# Patient Record
Sex: Female | Born: 1990 | Race: White | Hispanic: No | Marital: Married | Smoking: Never smoker
Health system: Southern US, Community
[De-identification: ages and names within clinical notes are randomized; demographics above are authoritative.]

## PROBLEM LIST (undated history)

## (undated) ENCOUNTER — Inpatient Hospital Stay (HOSPITAL_COMMUNITY): Payer: Self-pay

## (undated) DIAGNOSIS — E282 Polycystic ovarian syndrome: Secondary | ICD-10-CM

## (undated) DIAGNOSIS — K900 Celiac disease: Secondary | ICD-10-CM

## (undated) HISTORY — PX: APPENDECTOMY: SHX54

## (undated) HISTORY — PX: OTHER SURGICAL HISTORY: SHX169

## (undated) HISTORY — PX: TONSILLECTOMY: SUR1361

## (undated) HISTORY — PX: GASTRIC BYPASS: SHX52

---

## 2015-07-21 ENCOUNTER — Other Ambulatory Visit: Payer: Self-pay | Admitting: Orthopedic Surgery

## 2015-07-21 DIAGNOSIS — S92241A Displaced fracture of medial cuneiform of right foot, initial encounter for closed fracture: Secondary | ICD-10-CM

## 2015-07-23 ENCOUNTER — Ambulatory Visit
Admission: RE | Admit: 2015-07-23 | Discharge: 2015-07-23 | Disposition: A | Discharge status: Home / Self Care | Payer: BLUE CROSS/BLUE SHIELD | Source: Ambulatory Visit | Attending: Orthopedic Surgery | Admitting: Orthopedic Surgery

## 2015-07-23 DIAGNOSIS — S92241A Displaced fracture of medial cuneiform of right foot, initial encounter for closed fracture: Secondary | ICD-10-CM

## 2016-02-18 ENCOUNTER — Other Ambulatory Visit: Payer: Self-pay | Admitting: Obstetrics and Gynecology

## 2016-02-20 ENCOUNTER — Other Ambulatory Visit (HOSPITAL_COMMUNITY): Payer: Self-pay | Admitting: Obstetrics and Gynecology

## 2016-02-20 ENCOUNTER — Encounter (HOSPITAL_COMMUNITY): Payer: Self-pay | Admitting: Obstetrics and Gynecology

## 2016-02-20 DIAGNOSIS — Q369 Cleft lip, unilateral: Secondary | ICD-10-CM

## 2016-02-26 ENCOUNTER — Encounter (HOSPITAL_COMMUNITY): Payer: Self-pay

## 2016-02-26 ENCOUNTER — Encounter (HOSPITAL_COMMUNITY): Payer: Self-pay | Admitting: *Deleted

## 2016-02-26 ENCOUNTER — Inpatient Hospital Stay (HOSPITAL_COMMUNITY): Payer: Medicaid Other

## 2016-02-26 ENCOUNTER — Inpatient Hospital Stay (EMERGENCY_DEPARTMENT_HOSPITAL)
Admission: AD | Admit: 2016-02-26 | Discharge: 2016-02-26 | Disposition: A | Discharge status: Home / Self Care | Payer: Medicaid Other | Source: Ambulatory Visit | Attending: Obstetrics & Gynecology | Admitting: Obstetrics & Gynecology

## 2016-02-26 DIAGNOSIS — Z3A19 19 weeks gestation of pregnancy: Secondary | ICD-10-CM

## 2016-02-26 DIAGNOSIS — O26892 Other specified pregnancy related conditions, second trimester: Secondary | ICD-10-CM

## 2016-02-26 DIAGNOSIS — O42919 Preterm premature rupture of membranes, unspecified as to length of time between rupture and onset of labor, unspecified trimester: Secondary | ICD-10-CM | POA: Diagnosis not present

## 2016-02-26 DIAGNOSIS — N898 Other specified noninflammatory disorders of vagina: Secondary | ICD-10-CM

## 2016-02-26 DIAGNOSIS — O4702 False labor before 37 completed weeks of gestation, second trimester: Secondary | ICD-10-CM

## 2016-02-26 LAB — AMNISURE RUPTURE OF MEMBRANE (ROM) NOT AT ARMC: AMNISURE: POSITIVE

## 2016-02-26 LAB — WET PREP, GENITAL
Clue Cells Wet Prep HPF POC: NONE SEEN
SPERM: NONE SEEN
TRICH WET PREP: NONE SEEN
WBC WET PREP: NONE SEEN
Yeast Wet Prep HPF POC: NONE SEEN

## 2016-02-26 NOTE — MAU Provider Note (Signed)
Chief Complaint: No chief complaint on file.   First Provider Initiated Contact with Patient 02/26/16 2008      SUBJECTIVE HPI: Amanda [name] is a 25 y.o. G3P0020 at [redacted]w[redacted]d by LMP who presents to maternity admissions reporting gush of fluid last night that was light brown.  She reports a hx of subchorionic hemorrhage this pregnancy followed by her OB practice in [place].  She had the gush of fluid but thought initially it was related to her [facility].  She called her office and was seen today for her symptoms and had exam and [place].  She was told her water was broken and she could be admitted for IOL or D&E.  Pt came to MAU tonight for second opinion/more information about prognosis. She denies vaginal bleeding, vaginal itching/burning, urinary symptoms, h/a, dizziness, n/v, or fever/chills.     HPI  History reviewed. No pertinent past medical history. Past Surgical History  Procedure Laterality Date  . Tonsillectomy    . Appendectomy    . Foot surgery      Social History   Social History  . Marital Status: Married    Spouse Name: N/A  . Number of Children: N/A  . Years of Education: N/A   Occupational History  . Not on file.   Social History Main Topics  . Smoking status: Not on file  . Smokeless tobacco: Not on file  . Alcohol Use: Not on file  . Drug Use: Not on file  . Sexual Activity: Not on file   Other Topics Concern  . Not on file   Social History Narrative   No current facility-administered medications on file prior to encounter.   No current outpatient prescriptions on file prior to encounter.   Allergies  Allergen Reactions  . Gluten Meal Diarrhea    Celiac disease    ROS:  Review of Systems  Constitutional: Negative for fever, chills and fatigue.  Respiratory: Negative for shortness of breath.   Cardiovascular: Negative for chest pain.  Genitourinary: Positive for vaginal discharge. Negative for dysuria, flank pain, vaginal bleeding, difficulty urinating,  vaginal pain and pelvic pain.  Neurological: Negative for dizziness and headaches.  Psychiatric/Behavioral: Negative.      I have reviewed patient's Past Medical Hx, Surgical Hx, Family Hx, Social Hx, medications and allergies.   Physical Exam  Patient Vitals for the past 24 hrs:  BP Temp Temp src Pulse Resp Height Weight  02/26/16 1927 149/92 mmHg 99 F (37.2 C) Oral 98 20  (1.549 m) 261 lb 12 oz (118.729 kg)   Constitutional: Well-developed, well-nourished female in no acute distress.  Cardiovascular: normal rate Respiratory: normal effort GI: Abd soft, non-tender. Pos BS x 4 MS: Extremities nontender, no edema, normal ROM Neurologic: Alert and oriented x 4.  GU: Neg CVAT.  SS EXAM: Cervix pink, visually closed, without lesion, scant brown discharge, vaginal walls and external genitalia normal  FHT 160 by doppler  LAB RESULTS Results for orders placed or performed during the hospital encounter of 02/26/16 (from the past 24 hour(s))  Wet prep, genital     Status: None   Collection Time: 02/26/16  8:18 PM  Result Value Ref Range   Yeast Wet Prep HPF POC NONE SEEN NONE SEEN   Trich, Wet Prep NONE SEEN NONE SEEN   Clue Cells Wet Prep HPF POC NONE SEEN NONE SEEN   WBC, Wet Prep HPF POC NONE SEEN NONE SEEN   Sperm NONE SEEN   [facility] (  [facility]     Status: None   Collection Time: 02/26/16  8:18 PM  Result Value Ref Range   Amnisure ROM POSITIVE        IMAGING No results found. Preliminary with AFI 3.9, FHR 168   MAU Management/MDM: Ordered labs and US and reviewed results.  Likely previable PPROM with positive amnisure and oligohydramnios.  Discussed results and poor prognosis with pt.  Discussed options including expectant management, D&E (scheduled at later date), and admission tonight for IOL.  Pt prefers expectant management tonight and reports she [facility].  Recommend pt keep that appointment  and continue to discuss options.  Questions answered.  Pt stable at time of discharge.  ASSESSMENT 1. Preterm premature rupture of membranes (PPROM) with unknown onset of labor   2. [redacted] weeks gestation of pregnancy   3. Threatened preterm labor, second trimester   4. Vaginal discharge during pregnancy in second trimester     PLAN Discharge home Keep appt with MFM tomorrow    Medication List    TAKE these medications        CONCEPT DHA 53.5-38-1 MG Caps  Take 1 capsule by mouth daily.           Follow-up Information    Follow up with [facility].   Specialty:  Maternal and Fetal Medicine   Why:  Tomorrow as scheduled, Return to MAU as needed for emergencies   Contact information:   [address] [id number] mc [place] [id number] [phone]      Sharen CounterLisa Leftwich-Kirby Certified Nurse-Midwife 02/26/2016  9:34 PM

## 2016-02-26 NOTE — MAU Note (Addendum)
PT SAYS SHE  WAS LYING IN BED LAST NIGHT  AT 2330   -   FELT   A PINCH-   THEN SAW  BROWN  WATER-   X2 HRS.   DID  NOT  COME  IN - BC  THOUGHT   IT WAS PART OF  BLOOD CLOT.   WENT  TO OFFICE  THIS  AM  - U/S  SAYS  SROM-    BABY HAD  FHR.         VE - CLOSED.         TOLD  IF CRAMPING    TO COME  TO MAU.      STARTED  CRAMPING   AT 4 PM   SAYS NOTHING  COMING  OUT  NOW.

## 2016-02-26 NOTE — Discharge Instructions (Signed)

## 2016-02-27 ENCOUNTER — Ambulatory Visit (HOSPITAL_COMMUNITY)
Admission: RE | Admit: 2016-02-27 | Discharge: 2016-02-27 | Disposition: A | Discharge status: Home / Self Care | Payer: Medicaid Other | Source: Ambulatory Visit | Attending: Obstetrics and Gynecology | Admitting: Obstetrics and Gynecology

## 2016-02-27 ENCOUNTER — Other Ambulatory Visit (HOSPITAL_COMMUNITY): Payer: Self-pay | Admitting: Obstetrics and Gynecology

## 2016-02-27 ENCOUNTER — Encounter (HOSPITAL_COMMUNITY): Payer: Self-pay

## 2016-02-27 ENCOUNTER — Encounter (HOSPITAL_COMMUNITY): Payer: Self-pay | Admitting: *Deleted

## 2016-02-27 ENCOUNTER — Inpatient Hospital Stay (HOSPITAL_COMMUNITY)
Admission: AD | Admit: 2016-02-27 | Discharge: 2016-02-29 | DRG: 781 | Disposition: A | Discharge status: Home / Self Care | Payer: Medicaid Other | Source: Ambulatory Visit | Attending: Obstetrics & Gynecology | Admitting: Obstetrics & Gynecology

## 2016-02-27 VITALS — BP 130/86 | HR 84 | Wt 260.5 lb

## 2016-02-27 DIAGNOSIS — O42919 Preterm premature rupture of membranes, unspecified as to length of time between rupture and onset of labor, unspecified trimester: Secondary | ICD-10-CM | POA: Diagnosis not present

## 2016-02-27 DIAGNOSIS — Q369 Cleft lip, unilateral: Secondary | ICD-10-CM

## 2016-02-27 DIAGNOSIS — O99820 Streptococcus B carrier state complicating pregnancy: Secondary | ICD-10-CM | POA: Diagnosis present

## 2016-02-27 DIAGNOSIS — O468X2 Other antepartum hemorrhage, second trimester: Secondary | ICD-10-CM | POA: Diagnosis present

## 2016-02-27 DIAGNOSIS — Z3A19 19 weeks gestation of pregnancy: Secondary | ICD-10-CM

## 2016-02-27 DIAGNOSIS — Z91018 Allergy to other foods: Secondary | ICD-10-CM | POA: Diagnosis not present

## 2016-02-27 DIAGNOSIS — O283 Abnormal ultrasonic finding on antenatal screening of mother: Secondary | ICD-10-CM

## 2016-02-27 DIAGNOSIS — Z3A18 18 weeks gestation of pregnancy: Secondary | ICD-10-CM

## 2016-02-27 DIAGNOSIS — O359XX0 Maternal care for (suspected) fetal abnormality and damage, unspecified, not applicable or unspecified: Secondary | ICD-10-CM

## 2016-02-27 DIAGNOSIS — Z3689 Encounter for other specified antenatal screening: Secondary | ICD-10-CM

## 2016-02-27 DIAGNOSIS — O4702 False labor before 37 completed weeks of gestation, second trimester: Secondary | ICD-10-CM | POA: Diagnosis not present

## 2016-02-27 DIAGNOSIS — O42912 Preterm premature rupture of membranes, unspecified as to length of time between rupture and onset of labor, second trimester: Secondary | ICD-10-CM

## 2016-02-27 HISTORY — DX: Polycystic ovarian syndrome: E28.2

## 2016-02-27 HISTORY — DX: Celiac disease: K90.0

## 2016-02-27 LAB — TYPE AND SCREEN
ABO/RH(D): A POS
Antibody Screen: NEGATIVE

## 2016-02-27 LAB — RAPID URINE DRUG SCREEN, HOSP PERFORMED
Amphetamines: NOT DETECTED
BARBITURATES: NOT DETECTED
BENZODIAZEPINES: NOT DETECTED
COCAINE: NOT DETECTED
Opiates: NOT DETECTED
TETRAHYDROCANNABINOL: NOT DETECTED

## 2016-02-27 LAB — URINALYSIS, ROUTINE W REFLEX MICROSCOPIC
Bilirubin Urine: NEGATIVE
Glucose, UA: NEGATIVE mg/dL
Ketones, ur: 15 mg/dL — AB
NITRITE: NEGATIVE
PH: 5.5 (ref 5.0–8.0)
Protein, ur: NEGATIVE mg/dL
SPECIFIC GRAVITY, URINE: 1.02 (ref 1.005–1.030)

## 2016-02-27 LAB — URINE MICROSCOPIC-ADD ON

## 2016-02-27 LAB — ABO/RH: ABO/RH(D): A POS

## 2016-02-27 LAB — GC/CHLAMYDIA PROBE AMP (~~LOC~~) NOT AT ARMC
CHLAMYDIA, DNA PROBE: NEGATIVE
NEISSERIA GONORRHEA: NEGATIVE

## 2016-02-27 MED ORDER — ACETAMINOPHEN 325 MG PO TABS: 650.0000 mg | ORAL_TABLET | ORAL | Status: DC | PRN

## 2016-02-27 MED ORDER — PRENATAL MULTIVITAMIN CH
1.0000 | ORAL_TABLET | Freq: Every day | ORAL | Status: DC
  Administered 2016-02-28: 1 via ORAL
  Filled 2016-02-27: qty 1

## 2016-02-27 MED ORDER — AZITHROMYCIN 500 MG PO TABS
500.0000 mg | ORAL_TABLET | Freq: Every day | ORAL | Status: DC
  Administered 2016-02-27 – 2016-02-28 (×2): 500 mg via ORAL
  Filled 2016-02-27 (×3): qty 1

## 2016-02-27 MED ORDER — AMOXICILLIN 500 MG PO CAPS
500.0000 mg | ORAL_CAPSULE | Freq: Three times a day (TID) | ORAL | Status: DC
  Filled 2016-02-27: qty 1

## 2016-02-27 MED ORDER — CALCIUM CARBONATE ANTACID 500 MG PO CHEW: 2.0000 | CHEWABLE_TABLET | ORAL | Status: DC | PRN

## 2016-02-27 MED ORDER — SODIUM CHLORIDE 0.9 % IV SOLN
2.0000 g | Freq: Four times a day (QID) | INTRAVENOUS | Status: AC
  Administered 2016-02-27 – 2016-02-29 (×8): 2 g via INTRAVENOUS
  Filled 2016-02-27 (×8): qty 2000

## 2016-02-27 MED ORDER — DOCUSATE SODIUM 100 MG PO CAPS
100.0000 mg | ORAL_CAPSULE | Freq: Every day | ORAL | Status: DC
  Administered 2016-02-28: 100 mg via ORAL
  Filled 2016-02-27: qty 1

## 2016-02-27 MED ORDER — ZOLPIDEM TARTRATE 5 MG PO TABS: 5.0000 mg | ORAL_TABLET | Freq: Every evening | ORAL | Status: DC | PRN

## 2016-02-27 NOTE — Consult Note (Signed)
MFM consult  25 yr old G3P0020 at 6314w5d with previable PPROM, possible uterine septum, subchorionic hemorrhage, and finding of cleft lip on outside ultrasound referred by Dr. Leota JacobsenGaccione for fetal anatomic survey and consult..  Past OB hx: 2 first trimester miscarriages  This pregnancy has been complicated by intermittent vaginal bleeding from a subchorionic hemorrhage. Patient then had gush of fluid yesterday. Was evaluated and found to have a positive amnisure and oligohydramnios. Patient reports continued leaking of fluid. No fever or contractions.   Ultrasound today shows: single intrauterine pregnancy. Fetal biometry is consistent with dating. Anterior placenta without evidence of previa. There is a subchorionic hemorrahge seen in the lower uterine segment measuring 3x4cm. Oligohydramnios with maximum vertical pocket of 1.6cm. Normal transabdominal cervical length. The views of the cavum, palate, profile/nasal bone, lips, spine, heart, abdominal cord insertion, ankles, and hands are limited. On limited views of the lips there appears to be a cleft; the palate is not well visualized. The remainder of the limited anatomy survey is normal.   I counseled her as follows:  1. Appropriate fetal growth. 2. Limited anatomy survey: - recommend follow up in 3 weeks for fetal growth and reevaluate fetal anatomy - discussed limitations of ulttrasound in detecting fetal anomalies especially in the setting of oligohydramnios 3. PPROM: -  discussed increased risks of preterm delivery, preterm labor, infection, fetal demise, and nonreassuring fetal status  - discussed there is an increased risk of pulmonary hypoplasia (10-20%), fetal limb and body deformities (usually correct after delivery) with oligohydramnios/anhydramnios prior to 22 weeks  - patient currently does not have any signs/symptoms of infection or labor therefore would qualify for consideration of outpatient management after 48 hours of IV  antibiotics - if patient is managed as an outpatient recommend she take her temperature four times a day and present for immediate evaluation if has temp 100 degrees or greater or has cramping, contractions, uterine tenderness, or vaginal bleeding; she should also be seen by her provider at least once a week to evaluate for infection/labor and for fetal viability  - would recommend readmission at time of fetal viability or when patient would want intervention on fetal behalf and at that time readminister latency antibiotics and administer a course of betamethasone  - would then recommend inpatient management until delivery at 34 weeks or sooner for labor, infection, or nonreassuring fetal status  - recommend fetal ultrasounds every 3-4 weeks to evaluate growth  - once viable would recommend at least daily fetal assessment  - do not recommend strict bedrest or trendelenburg as data does not show any benefit  - once patient is admitted she will have decreased activity and therefore recommend SCDs while in bed  - recommend a NICU consult at this time to aid the patient in deciding the gestational age she would like intervention  - patient has chosen expectant management - being admitted for 48 hours of IV antibiotics; will return for prolonged hospitalization at viability for betamethasone and repeat antibiotics 4. Possible uterine septum: - cannot be seen on today's ultrasound - recommend serial growth as above - recommend reevaluate postpartum 5. Subchorionic hemorrhage: - recommend bleeding precautions 6. Likely cleft lip: - discussed possible association with other anomalies- none seen on today's limited ultrasound; discussed if found with other anomalies the increased risk of genetic conditions - when found in isolation that risk is not significantly increased - will refer to Pediatric plastic surgery - discussed neonate will need repair and aid in feeding - discussed possibility  of cleft  palate- palate not well seen on today's exam 4. Low PAPP-A: I discussed the association with fetal aneuploidy- although risk for aneuploidy was low on first trimester screen. I also discussed the association with increased risk of adverse pregnancy outcomes including increased risk of fetal growth restriction, preeclampsia, stillbirth, preterm birth, and abruption. I discussed these risks are only slightly increased over baseline but heightened surveillance is warranted. I recommend serial fetal growth ultrasounds. I recommend close surveillance for the development of signs/symptoms of preeclampsia. 5. Borderline MSAFP of 2. : - likely related to cleft lip and/or subchorionic hemorrhage  Patient sent for admission at [facility]. Discussed with Dr. Debroah Loop and Dr. Mel Almond.  I spent a total of 60 minutes with the patient of which >50% was in face to face consultation.  Eulis Foster, MD

## 2016-02-27 NOTE — H&P (Signed)
SUBJECTIVE HPI: Amanda [name] is a 25 y.o. G3P0020 at [redacted]w[redacted]d by LMP who presents to maternity admissions reporting gush of fluid last night that was light brown. She reports a hx of subchorionic hemorrhage this pregnancy followed by her OB practice in [place]. She had the gush of fluid but thought initially it was related to her [facility]. She called her office and was seen today for her symptoms and had exam and [place]. She was told her water was broken and she could be admitted for IOL or D&E. Pt came to MAU tonight for second opinion/more information about prognosis. She denies vaginal bleeding, vaginal itching/burning, urinary symptoms, h/a, dizziness, n/v, or fever/chills.  [place] in [facility] today confirmed ROM and Dr. Vincenza Hews recommended admission for 48 hr of IV antibiotic  HPI  History reviewed. No pertinent past medical history. Past Surgical History  Procedure Laterality Date  . Tonsillectomy    . Appendectomy    . Foot surgery      Social History   Social History  . Marital Status: Married    Spouse Name: N/A  . Number of Children: N/A  . Years of Education: N/A   Occupational History  . Not on file.   Social History Main Topics  . Smoking status: Not on file  . Smokeless tobacco: Not on file  . Alcohol Use: Not on file  . Drug Use: Not on file  . Sexual Activity: Not on file   Other Topics Concern  . Not on file   Social History Narrative   No current facility-administered medications on file prior to encounter.   No current outpatient prescriptions on file prior to encounter.   Allergies  Allergen Reactions  . Gluten Meal Diarrhea    Celiac disease    ROS:  Review of Systems  Constitutional: Negative for fever, chills and fatigue.  Respiratory: Negative for shortness of breath.  Cardiovascular: Negative for chest pain.  Genitourinary: Positive for vaginal discharge. Negative for  dysuria, flank pain, vaginal bleeding, difficulty urinating, vaginal pain and pelvic pain.  Neurological: Negative for dizziness and headaches.  Psychiatric/Behavioral: Negative.     I have reviewed patient's Past Medical Hx, Surgical Hx, Family Hx, Social Hx, medications and allergies.   Physical Exam  Patient Vitals for the past 24 hrs:  BP Temp Temp src Pulse Resp Height Weight  02/26/16 1927 149/92 mmHg 99 F (37.2 C) Oral 98 20  (1.549 m) 261 lb 12 oz (118.729 kg)  Blood pressure 126/84, pulse 76, temperature 98.7 F (37.1 C), temperature source Oral, resp. rate 18, last menstrual period 10/12/2015, SpO2 100 %.  Constitutional: Well-developed, well-nourished female in no acute distress.  Cardiovascular: normal rate Respiratory: normal effort GI: Abd soft, non-tender. Pos BS x 4 MS: Extremities nontender, no edema, normal ROM Neurologic: Alert and oriented x 4.  GU: Neg CVAT.  SS EXAM: done in MAU 02/26/16 Cervix pink, visually closed, without lesion, scant brown discharge, vaginal walls and external genitalia normal  FHT 160 by doppler  LAB RESULTS  Lab Results Last 24 Hours    Results for orders placed or performed during the hospital encounter of 02/26/16 (from the past 24 hour(s))  Wet prep, genital Status: None   Collection Time: 02/26/16 8:18 PM  Result Value Ref Range   Yeast Wet Prep HPF POC NONE SEEN NONE SEEN   Trich, Wet Prep NONE SEEN NONE SEEN   Clue Cells Wet Prep HPF POC NONE SEEN NONE SEEN   WBC, Wet Prep  HPF POC NONE SEEN NONE SEEN   Sperm NONE SEEN   Amnisure [facility] ([facility] Status: None   Collection Time: 02/26/16 8:18 PM  Result Value Ref Range   Amnisure ROM POSITIVE          IMAGING        Preliminary with AFI 3.9, FHR 168 Per Dr Vincenza HewsQuinn low AFI confirmed with   ASSESSMENT 1. Preterm premature rupture of membranes (PPROM) with unknown onset of  labor   2. [redacted] weeks gestation of pregnancy   3. Threatened preterm labor, second trimester   4. Vaginal discharge during pregnancy in second trimester         Medication List    TAKE these medications       CONCEPT DHA 53.5-38-1 MG Caps  Take 1 capsule by mouth daily.              Plan: Discussed with Dr. Vincenza HewsQuinn who requested admission for latency antibiotics and expectant management with 48 hr of IV antibiotics followed by PO therapy for a total course of 7 days   Adam PhenixJames G Arnold, MD 02/27/2016 1:18 PM

## 2016-02-28 LAB — CULTURE, BETA STREP (GROUP B ONLY)

## 2016-02-28 NOTE — Progress Notes (Signed)
Results for Danton SewerOWENS, Zamorah (MRN [id number]) as of 02/28/2016 16:15  Ref. Range 02/27/2016 13:45  CULTURE, BETA STREP (GROUP B ONLY) Unknown Rpt (A)  Karyl KinnierKim Newton, CNM notified of Positive GBS culture.

## 2016-02-28 NOTE — Progress Notes (Signed)
Chaplain visit the result of a Spiritual Consult placed in Amanda [name]' medical record. Amanda [name] is apprehensive about the continuance of her pregnancy given that the rupture of membranes and, thus, the viability issues that may ensue. She is being given strong spiritual support by her community of faith, her family and friends. She reports she is less stressful when they visit, than the hours she spends alone in her room. She is open about the anxiety she feels concerning the possible loss of her son. Chaplains and staff may present an unknown stress factor if perceived as visiting to prepare Amanda [name] for fetal demise, rather than our true care goal of providing exceptional care no matter what may happen. In light of this it is important that chaplains continue to provide confidential emotional and spiritual support with a focus on stress reduction and spiritual support. Support to family is important as well, ensuring they have the emotional and spiritual care they need during this time of hope and waiting,  Chaplain follow-up is strongly advised.  Benjie Karvonenharles D. Dalina Samara, DMin, MDiv, MA Chaplain

## 2016-02-28 NOTE — Progress Notes (Signed)
FACULTY PRACTICE ANTEPARTUM(COMPREHENSIVE) NOTE  Danton SewerShawnee Melching is a 25 y.o. G3P0020 at 5333w6d by early ultrasound who is admitted for rupture of membranes.   Fetal presentation is unsure. Length of Stay:  1  Days  Subjective:  Patient reports the fetal movement as doesn't feel yet. Patient reports uterine contraction  activity as none. Patient reports  vaginal bleeding as none. Patient describes fluid per vagina as None.  Vitals:  Blood pressure 110/62, pulse 74, temperature 98.2 F (36.8 C), temperature source Oral, resp. rate 20, height 5\' 1"  (1.549 m), weight 117.935 kg (260 lb), last menstrual period 10/12/2015, SpO2 98 %. Physical Examination:  General appearance - alert, well appearing, and in no distress Heart - normal rate and regular rhythm Abdomen - soft, nontender, Fundal Height:  size equals dates not tender Membranes:intact, ruptured  Fetal Monitoring:     Fetal Heart Rate A      Mode  Doppler filed at 02/27/2016 2321    Baseline Rate (A)  151 bpm filed at 02/27/2016 2321      Labs:  Results for orders placed or performed during the hospital encounter of 02/27/16 (from the past 24 hour(s))  Type and screen [facility]   Collection Time: 02/27/16  1:22 PM  Result Value Ref Range   ABO/RH(D) A POS    Antibody Screen NEG    Sample Expiration 03/01/2016   ABO/Rh   Collection Time: 02/27/16  1:22 PM  Result Value Ref Range   ABO/RH(D) A POS   Urinalysis, Routine w reflex microscopic (not at [facility])   Collection Time: 02/27/16  1:44 PM  Result Value Ref Range   Color, Urine YELLOW YELLOW   APPearance CLEAR CLEAR   Specific Gravity, Urine 1.020 1.005 - 1.030   pH 5.5 5.0 - 8.0   Glucose, UA NEGATIVE NEGATIVE mg/dL   Hgb urine dipstick LARGE (A) NEGATIVE   Bilirubin Urine NEGATIVE NEGATIVE   Ketones, ur 15 (A) NEGATIVE mg/dL   Protein, ur NEGATIVE NEGATIVE mg/dL   Nitrite NEGATIVE NEGATIVE   Leukocytes, UA TRACE (A) NEGATIVE  Urine rapid  drug screen (hosp performed)   Collection Time: 02/27/16  1:44 PM  Result Value Ref Range   Opiates NONE DETECTED NONE DETECTED   Cocaine NONE DETECTED NONE DETECTED   Benzodiazepines NONE DETECTED NONE DETECTED   Amphetamines NONE DETECTED NONE DETECTED   Tetrahydrocannabinol NONE DETECTED NONE DETECTED   Barbiturates NONE DETECTED NONE DETECTED  Urine microscopic-add on   Collection Time: 02/27/16  1:44 PM  Result Value Ref Range   Squamous Epithelial / LPF 6-30 (A) NONE SEEN   WBC, UA 0-5 0 - 5 WBC/hpf   RBC / HPF 0-5 0 - 5 RBC/hpf   Bacteria, UA FEW (A) NONE SEEN    I  Medications:  Scheduled . ampicillin (OMNIPEN) IV  2 g Intravenous Q6H   Followed by  . [START ON 02/29/2016] amoxicillin  500 mg Oral Q8H  . azithromycin  500 mg Oral Daily  . docusate sodium  100 mg Oral Daily  . prenatal multivitamin  1 tablet Oral Q1200   I have reviewed the patient's current medications.  ASSESSMENT: Patient Active Problem List   Diagnosis Date Noted  . Preterm premature rupture of membranes (PPROM) with unknown onset of labor 02/27/2016    PLAN: Admitted for IV latency antibiotic for 48 hr then d/c if stable  ARNOLD,JAMES 02/28/2016,7:53 AM

## 2016-02-29 DIAGNOSIS — O42912 Preterm premature rupture of membranes, unspecified as to length of time between rupture and onset of labor, second trimester: Principal | ICD-10-CM

## 2016-02-29 MED ORDER — AMOXICILLIN 500 MG PO CAPS: 500.0000 mg | ORAL_CAPSULE | Freq: Three times a day (TID) | ORAL | Status: DC

## 2016-02-29 MED ORDER — AZITHROMYCIN 500 MG PO TABS: 500.0000 mg | ORAL_TABLET | Freq: Every day | ORAL | Status: DC

## 2016-02-29 NOTE — Discharge Instructions (Signed)
Premature Rupture and Preterm Premature Rupture of Membranes °Premature rupture of membranes (PROM) is when the membranes (amniotic sac) break open before contractions or labor starts. Rupture of membranes is commonly referred to as your water breaking. If PROM occurs before 37 weeks of pregnancy, it is called preterm premature rupture of membranes (PPROM). The amniotic sac holds the fetus, keeps infection out, and performs other important functions. Having the amniotic sac rupture before 37 weeks of pregnancy can lead to serious problems and requires immediate attention by your health care provider. °CAUSES  °PROM near the end of the pregnancy may be caused by natural weakening of the membranes. PPROM is often due to an infection. Other factors that may be associated with PROM include: °· Stretching of the amniotic sac because of carrying multiples or having too much amniotic fluid. °· Trauma. °· Smoking during pregnancy. °· Poor nutrition. °· Previous preterm birth. °· Vaginal bleeding. °· Little to no prenatal care. °· Problems with the placenta, such as placenta previa or placental abruption. °RISKS OF PROM AND PPROM °· Delivering a premature baby. °· Getting a serious infection of the placental tissues (chorioamnionitis). °· Early detachment of the placenta from the uterus (placental abruption). °· Compression of the umbilical cord. °· Needing a cesarean birth. °· Developing a serious infection after delivery. °SIGNS OF PROM OR PPROM  °· A sudden gush or slow leaking of fluid from the vagina. °· Constant wet underwear. °Sometimes, women mistake the leaking or wetness for urine, especially if the leak is slow and not a gush of fluid. If there is constant leaking or your underwear continues to get wet, your membranes have likely ruptured. °WHAT TO DO IF YOU THINK YOUR MEMBRANES HAVE RUPTURED °Call your health care provider right away. You will need to go to the hospital to get checked immediately. °WHAT HAPPENS  IF YOU ARE DIAGNOSED WITH PROM OR PPROM? °Once you arrive at the hospital, you will have tests done. A cervical exam will be performed to check if the cervix has softened or started to open (dilate). If you are diagnosed with PROM, you may be induced within 24 hours if you are not having contractions. If you are diagnosed with PPROM and are not having contractions, you may be induced depending on your trimester.  °If you have PPROM, you: °· And your baby will be monitored closely for signs of infection or other complications. °· May be given an antibiotic medicine to lower the chances of an infection developing. °· May be given a steroid medicine to help mature the baby's lungs faster. °· May be given a medicine to stop preterm labor. °· May be ordered to be on bed rest at home or in the hospital. °· May be induced if complications arise for you or the baby. °Your treatment will depend on many factors, such as how far along you are, the development of the baby, and other complications that may arise. °  °This information is not intended to replace advice given to you by your health care provider. Make sure you discuss any questions you have with your health care provider. °  °Document Released: 09/06/2005 Document Revised: 06/27/2013 Document Reviewed: 12/26/2012 °Elsevier Interactive Patient Education ©2016 [employer] ° °

## 2016-02-29 NOTE — Discharge Summary (Signed)
    OB Discharge Summary     Patient Name: Amanda [name] DOB: 08/13/1991 MRN: [id number]  Date of admission: 02/27/2016 Date of discharge: 02/29/2016  Admitting diagnosis: 18WKS, PREMATURE ROM Intrauterine pregnancy: 5240w0d     Secondary diagnosis:  Active Problems:   Preterm premature rupture of membranes (PPROM) with unknown onset of labor  Additional problems: None     Discharge diagnosis: Same                                                                                                Hospital course:  This is a 25 year old G3 P0 0-0 at 19 weeks who was admitted due to oligohydramnios and preterm premature rupture of membranes at a previable state. The patient had been sent to MFM from her primary OBs office for second opinion.  Please refer to the MFM's consult note for further details of their conversation. In short, the patient was offered admission for administration of latency antibiotics.  The patient has finished 2 days of IV antibiotics and will plead her course with oral antibiotics at home. The patient is being discharged in stable condition to follow-up in our high risk clinic within a week.  Physical exam  Filed Vitals:   02/28/16 1200 02/28/16 1725 02/28/16 2200 02/29/16 0600  BP: 107/61 143/88 136/83 118/76  Pulse: 88 104 96 97  Temp: 98.4 F (36.9 C) 98.1 F (36.7 C) 98.9 F (37.2 C) 98.4 F (36.9 C)  TempSrc: Oral Oral Oral Oral  Resp: 20 18 20 20   Height:      Weight:      SpO2: 100% 100% 97% 97%   General: alert, cooperative and no distress Heart: regular rate, no murmur Lungs: clear to auscultation bilaterally, no wheezing.  Abdomen: Soft, nontender, nondistended, appropriate bowel sounds. Fundus approximately dates. DVT Evaluation: No evidence of DVT seen on physical exam. Negative Homan's sign. No cords or calf tenderness. Labs:No results found for: WBC, HGB, HCT, MCV, PLT No flowsheet data found.  Discharge instruction: per After Visit Summary and  "Baby and Me Booklet".  After visit meds:    Medication List    TAKE these medications        amoxicillin 500 MG capsule  Commonly known as:  AMOXIL  Take 1 capsule (500 mg total) by mouth every 8 (eight) hours.     azithromycin 500 MG tablet  Commonly known as:  ZITHROMAX  Take 1 tablet (500 mg total) by mouth daily.     CONCEPT DHA 53.5-38-1 MG Caps  Take 1 capsule by mouth daily.        Diet: routine diet  Activity: Advance as tolerated. Pelvic rest for 6 weeks.   Outpatient follow up:1 week Follow up Appt:Future Appointments Date Time Provider Department Center  03/18/2016 3:45 PM [facility] US 2 [facility]   Follow up Visit:No Follow-up on file.   02/29/2016 Amanda Joye JEHIEL, DO

## 2016-02-29 NOTE — Progress Notes (Signed)
Discharge instructions reviewed with patient.  Patient states understanding of home care, medications, activity, signs/symptoms to report to MD and return MD office visit.  Patients significant other and family will assist with her care @ home.  No home  equipment needed, patient has prescriptions and all personal belongings.  Patient discharged via wheelchair in stable condition with staff without incident.  

## 2016-03-04 ENCOUNTER — Other Ambulatory Visit (HOSPITAL_COMMUNITY): Payer: Self-pay

## 2016-03-04 ENCOUNTER — Encounter (HOSPITAL_COMMUNITY): Payer: Self-pay

## 2016-03-05 ENCOUNTER — Ambulatory Visit (HOSPITAL_COMMUNITY): Payer: BLUE CROSS/BLUE SHIELD

## 2016-03-11 ENCOUNTER — Ambulatory Visit (INDEPENDENT_AMBULATORY_CARE_PROVIDER_SITE_OTHER): Payer: Medicaid Other | Admitting: Obstetrics & Gynecology

## 2016-03-11 ENCOUNTER — Encounter: Payer: Self-pay | Admitting: Obstetrics & Gynecology

## 2016-03-11 VITALS — BP 132/88 | HR 99 | Ht 62.0 in | Wt 260.0 lb

## 2016-03-11 DIAGNOSIS — O0992 Supervision of high risk pregnancy, unspecified, second trimester: Secondary | ICD-10-CM

## 2016-03-11 DIAGNOSIS — O42912 Preterm premature rupture of membranes, unspecified as to length of time between rupture and onset of labor, second trimester: Secondary | ICD-10-CM

## 2016-03-11 DIAGNOSIS — O359XX0 Maternal care for (suspected) fetal abnormality and damage, unspecified, not applicable or unspecified: Secondary | ICD-10-CM

## 2016-03-11 DIAGNOSIS — O280 Abnormal hematological finding on antenatal screening of mother: Secondary | ICD-10-CM

## 2016-03-11 DIAGNOSIS — O359XX1 Maternal care for (suspected) fetal abnormality and damage, unspecified, fetus 1: Secondary | ICD-10-CM

## 2016-03-11 DIAGNOSIS — O0990 Supervision of high risk pregnancy, unspecified, unspecified trimester: Secondary | ICD-10-CM | POA: Insufficient documentation

## 2016-03-11 DIAGNOSIS — O289 Unspecified abnormal findings on antenatal screening of mother: Secondary | ICD-10-CM

## 2016-03-11 LAB — POCT URINALYSIS DIP (DEVICE)
BILIRUBIN URINE: NEGATIVE
Glucose, UA: NEGATIVE mg/dL
HGB URINE DIPSTICK: NEGATIVE
Ketones, ur: NEGATIVE mg/dL
LEUKOCYTES UA: NEGATIVE
Nitrite: NEGATIVE
Protein, ur: NEGATIVE mg/dL
Urobilinogen, UA: 0.2 mg/dL (ref 0.0–1.0)
pH: 5 (ref 5.0–8.0)

## 2016-03-11 NOTE — Progress Notes (Signed)
Patient reports small amount of leaking at night

## 2016-03-11 NOTE — Progress Notes (Signed)
   Subjective:    Amanda [name] is a 25 y.o. G3P0020 at 2761w4d being seen today for her first obstetrical visit [facility]; was [facility]. Her obstetrical history is significant for previable PPROM, possible uterine septum, subchorionic hemorrhage, and finding of [facility]; [facility]. Also had SAB x 2.  Patient is appropriately sad, accompanied by her husband and mother. Already being followed by MFM, please refer to Dr. Elise BenneQuinn's notes on 02/27/2016 for recommendations.  Patient reports no complaints today. No abdominal pain, fevers, abnormal discharge. Scant LOF.  Filed Vitals:   03/11/16 1024 03/11/16 1025  BP: 132/88   Pulse: 99   Height:  5\' 2"  (1.575 m)  Weight: 260 lb (117.935 kg)     HISTORY: OB History  Gravida Para Term Preterm AB SAB TAB Ectopic Multiple Living  3 0 0 0 2 2 0 0 0 0     # Outcome Date GA Lbr Len/2nd Weight Sex Delivery Anes PTL Lv  3 Current           2 SAB           1 SAB              Past Medical History  Diagnosis Date  . Celiac disease   . Polycystic ovarian syndrome    Past Surgical History  Procedure Laterality Date  . Tonsillectomy    . Appendectomy    . Foot surgery      History reviewed. No pertinent family history.   Exam    Uterus:  Fundal Height: 20 cm  FHR 155  Pelvic Exam: Deferred  System: Breast:  Deferred   Skin: normal coloration and turgor, no rashes    Neurologic: oriented, normal, negative, depressed mood   Extremities: normal strength, tone, and muscle mass   HEENT PERRLA, extra ocular movement intact and sclera clear, anicteric   Mouth/Teeth mucous membranes moist, pharynx normal without lesions and dental hygiene good   Neck supple and no masses   Cardiovascular: regular rate and rhythm   Respiratory:  appears well, vitals normal, no respiratory distress, acyanotic, normal RR, chest clear, no wheezing, crepitations, rhonchi, normal  symmetric air entry   Abdomen: soft, non-tender; bowel sounds normal; no masses,  no organomegaly      Assessment:    Pregnancy: [id number] Patient Active Problem List   Diagnosis Date Noted  . Supervision of high-risk pregnancy 03/11/2016  . Preterm premature rupture of membranes in second trimester 02/27/2016        Plan:   1. Premature rupture of membranes in second trimester, unspecified duration to onset of labor Stable for now. Will come back for admission at [redacted]w[redacted]d.  Infection/bleeding precautions reviewed. Will follow MFM recommendations.  2. Supervision of high-risk pregnancy, second trimester - Prenatal Profile - Cystic fibrosis gene test - Culture, OB Urine - Alpha fetoprotein, maternal (had normal first screen) Continue prenatal vitamins. Problem list reviewed and updated. Preterm labor and fetal movement precautions reviewed. The nature of [facility] with multiple MDs and other Advanced Practice Providers was explained to patient; also emphasized that residents, students are part of our team. Follow up in 2 weeks; will [facility] then (declined to meet with her today).   Tereso NewcomerANYANWU,Melburn Treiber A, MD 03/11/2016

## 2016-03-11 NOTE — Patient Instructions (Signed)
Return to clinic for any scheduled appointments or obstetric concerns, or go to MAU for evaluation  

## 2016-03-12 LAB — PRENATAL PROFILE (SOLSTAS)
Antibody Screen: NEGATIVE
BASOS PCT: 0 %
Basophils Absolute: 0 cells/uL (ref 0–200)
EOS PCT: 1 %
Eosinophils Absolute: 105 cells/uL (ref 15–500)
HEMATOCRIT: 40.6 % (ref 35.0–45.0)
HEMOGLOBIN: 14.1 g/dL (ref 11.7–15.5)
HEP B S AG: NEGATIVE
HIV 1&2 Ab, 4th Generation: NONREACTIVE
LYMPHS ABS: 2730 {cells}/uL (ref 850–3900)
Lymphocytes Relative: 26 %
MCH: 31.4 pg (ref 27.0–33.0)
MCHC: 34.7 g/dL (ref 32.0–36.0)
MCV: 90.4 fL (ref 80.0–100.0)
MPV: 9.7 fL (ref 7.5–12.5)
Monocytes Absolute: 945 cells/uL (ref 200–950)
Monocytes Relative: 9 %
NEUTROS ABS: 6720 {cells}/uL (ref 1500–7800)
NEUTROS PCT: 64 %
Platelets: 259 10*3/uL (ref 140–400)
RBC: 4.49 MIL/uL (ref 3.80–5.10)
RDW: 13.5 % (ref 11.0–15.0)
Rh Type: POSITIVE
Rubella: 1.55 Index — ABNORMAL HIGH (ref <0.90)
WBC: 10.5 10*3/uL (ref 3.8–10.8)

## 2016-03-12 LAB — ALPHA FETOPROTEIN, MATERNAL
AFP: 173.7 ng/mL
CURR GEST AGE: 20.6 wk
MOM FOR AFP: 4.15
Open Spina bifida: POSITIVE — AB
Osb Risk: 1:13 {titer}

## 2016-03-13 LAB — CULTURE, OB URINE
Colony Count: NO GROWTH
Organism ID, Bacteria: NO GROWTH

## 2016-03-15 ENCOUNTER — Encounter: Payer: Self-pay | Admitting: Obstetrics & Gynecology

## 2016-03-15 DIAGNOSIS — O359XX0 Maternal care for (suspected) fetal abnormality and damage, unspecified, not applicable or unspecified: Secondary | ICD-10-CM | POA: Insufficient documentation

## 2016-03-15 DIAGNOSIS — O280 Abnormal hematological finding on antenatal screening of mother: Secondary | ICD-10-CM | POA: Insufficient documentation

## 2016-03-15 NOTE — Addendum Note (Signed)
Addended by: Jaynie CollinsANYANWU, [name] on: 03/15/2016 10:14 AM   Modules accepted: Orders

## 2016-03-17 ENCOUNTER — Telehealth: Payer: Self-pay | Admitting: General Practice

## 2016-03-17 ENCOUNTER — Ambulatory Visit (HOSPITAL_COMMUNITY): Payer: Medicaid Other

## 2016-03-17 ENCOUNTER — Ambulatory Visit (HOSPITAL_COMMUNITY)
Admission: RE | Admit: 2016-03-17 | Payer: Medicaid Other | Source: Ambulatory Visit | Attending: Obstetrics and Gynecology | Admitting: Obstetrics and Gynecology

## 2016-03-17 NOTE — Telephone Encounter (Signed)
Per Dr Macon LargeAnyanwu, patient's AFP was elevated for spina bifida & needs genetic counseling/NIPS offered. Patient's ultrasound appt was moved up to 6/28 with genetic counseling but we were never able to reach patient with appt change. Called MFM and changed growth ultrasound back to 6/29 @ 2pm & genetic counseling to 6/30 @ 10am. Called patient and informed her of results & appt change/new appts. Patient verbalized understanding to all & had no questions

## 2016-03-18 ENCOUNTER — Ambulatory Visit (HOSPITAL_COMMUNITY)
Admission: RE | Admit: 2016-03-18 | Discharge: 2016-03-18 | Disposition: A | Discharge status: Home / Self Care | Payer: Medicaid Other | Source: Ambulatory Visit | Attending: Obstetrics & Gynecology | Admitting: Obstetrics & Gynecology

## 2016-03-18 ENCOUNTER — Encounter (HOSPITAL_COMMUNITY): Payer: Self-pay

## 2016-03-18 ENCOUNTER — Ambulatory Visit (HOSPITAL_COMMUNITY): Payer: BLUE CROSS/BLUE SHIELD

## 2016-03-18 VITALS — BP 129/91 | HR 97 | Temp 98.3°F | Wt 264.4 lb

## 2016-03-18 DIAGNOSIS — O359XX1 Maternal care for (suspected) fetal abnormality and damage, unspecified, fetus 1: Secondary | ICD-10-CM

## 2016-03-18 DIAGNOSIS — O0992 Supervision of high risk pregnancy, unspecified, second trimester: Secondary | ICD-10-CM

## 2016-03-18 DIAGNOSIS — O280 Abnormal hematological finding on antenatal screening of mother: Secondary | ICD-10-CM

## 2016-03-18 DIAGNOSIS — O289 Unspecified abnormal findings on antenatal screening of mother: Secondary | ICD-10-CM | POA: Diagnosis not present

## 2016-03-18 DIAGNOSIS — O42912 Preterm premature rupture of membranes, unspecified as to length of time between rupture and onset of labor, second trimester: Secondary | ICD-10-CM | POA: Diagnosis present

## 2016-03-18 DIAGNOSIS — O99212 Obesity complicating pregnancy, second trimester: Secondary | ICD-10-CM | POA: Diagnosis not present

## 2016-03-18 DIAGNOSIS — Z3A21 21 weeks gestation of pregnancy: Secondary | ICD-10-CM | POA: Diagnosis not present

## 2016-03-18 DIAGNOSIS — O4290 Premature rupture of membranes, unspecified as to length of time between rupture and onset of labor, unspecified weeks of gestation: Secondary | ICD-10-CM | POA: Diagnosis not present

## 2016-03-18 DIAGNOSIS — O42919 Preterm premature rupture of membranes, unspecified as to length of time between rupture and onset of labor, unspecified trimester: Secondary | ICD-10-CM

## 2016-03-19 ENCOUNTER — Ambulatory Visit (HOSPITAL_COMMUNITY): Admission: RE | Admit: 2016-03-19 | Payer: Medicaid Other | Source: Ambulatory Visit

## 2016-03-19 DIAGNOSIS — Z3A21 21 weeks gestation of pregnancy: Secondary | ICD-10-CM | POA: Insufficient documentation

## 2016-03-19 NOTE — Progress Notes (Signed)
Genetic Counseling  High-Risk Gestation Note  Appointment Date:  03/19/2016 Referred By: Tereso NewcomerAnyanwu, [name], MD Date of Birth:  11/17/1990    Pregnancy History: G3P0020 Estimated Date of Delivery: 07/25/16 Estimated Gestational Age: 5874w4d Attending: Alpha GulaPaul Whitecar, MD    Amanda [name] and her partner, Mr. Raj JanusBobby Mcwatters, were seen for consultation for genetic counseling because of an elevated MSAFP of 4.15 MoMs based on maternal serum screening through [facility].  The patient's mother also accompanied the couple to today's visit. Patient previously seen in MFM for possible fetal cleft lip and previable PPROM in current pregnancy.   In summary:  Discussed elevated MSAFP ([facility] [place] and [facility])  Reviewed possible explanations for elevation  Most likely related to current oligohydramnios related to PPROM in pregnancy and subchorionic hemorrhage   Suspected fetal cleft lip  Reviewed that visualization on ultrasound has been limited due to oligohydramnios  Briefly reviewed etiologies of cleft lip and syndromic versus nonsyndromic  Discussed additional available screening options to assess for underlying fetal aneuploidy or single gene conditions  Patient declined genetic screening or testing at this time  She stated her primary focus is staying pregnant  Couple reported no family history concerns  Patient seems to understand the poor prognosis associated with oligohydramnios at the current gestational age  Amanda [name] was previously referred to MFM given suspected fetal cleft lip on ultrasound in her primary OB office. However, at the time she presented to MFM on 02/27/16, PPROM was diagnosed. Ultrasound on 02/27/16 visualized subchorionic hemorrhage and oligohydramnios. Fetal cleft lip was visualized, but visualization was significantly limited of fetal anatomy given the oligohydramnios. Ultrasound performed today also visualized  oligohydramnios, and thus fetal anatomy was suboptimally visualized.   Amanda [name] had MSAFP drawn at her primary OB office on 02/18/16, which was reported as borderline elevated (2.33 MoM). Ms. Barry [name] [facility] and AFP was redrawn on 03/11/16 and reported to be elevated at 4.15 MoMs. We reviewed Amanda [name]' maternal serum screening result, the elevation of MSAFP, and the associated reported risk for a fetal open neural tube defect.   We discussed alternative explanations for an elevated MSAFP including: normal variation, twins, feto-maternal bleeding, a gestational dating error, abdominal wall defects, kidney differences, oligohydramnios, and placental problems.  We discussed that given the known PPROM and subsequent oligohydramnios and timing of AFP bloodwork, the elevated AFP is highly suspected to be due to the underlying oligohydramnios.   We briefly discussed the ultrasound finding of cleft lip. In normal embryological development, the fetal lip usually closes by 7-[redacted] weeks gestation and the fetal palate usually closes by [redacted] weeks gestation.  When parts of these structures do not fuse properly, cleft lip and/or palate (CL/P) results.  CL/P is twice as common in males as it is in females. Approximately 25% (1 in 4) of all cleft lip and/or palate (CL/P) cases are cleft lip only, 50% (1 in 2) are cleft lip and palate, and 25% (1 in 4) are cleft palate only.  The incidence of CL/P varies in different ethnic populations; it occurs in approximately 1 in 1,000 Caucasian births.  In addition to ethnicity, other factors may increase the chance of a CL/P including some prenatal exposures, alcohol and drug use, cigarette smoking, or folic acid deficiency.  CL/P is most often an isolated condition, but can be present in combination with other birth defects possibly as part of a genetic syndrome. Approximately,  7-13% of individuals with a cleft lip and 11-14% of individuals with  a cleft lip and palate are born with additional birth defects.  Many genetic syndromes are associated with cleft lip and/or palate which may not be identified on ultrasound and would not be detected by amniocentesis.  For this reason, a genetics evaluation may be recommended sometime after birth in order to assess for an underlying genetic syndrome.  When there is no syndrome as the cause, then the cleft lip or palate is typically suspected to be caused by a combination of genetic and environmental factors (multifactorial inheritance). The couple understands that detailed evaluation of fetal anatomy by ultrasound is limited by oligohydramnios in the current pregnancy. We briefly discussed additional prenatal screening to assess for underlying chromosome conditions associated with cleft lip via noninvasive prenatal screening (NIPS).   Amanda [name] has been previously counseled that the finding of significant oligohydramnios during the second-trimester is associated with a high risk of perinatal mortality, secondary to pulmonary hypoplasia and respiratory distress. Literature suggests that the combination of second-trimester and elevated MSAFP is associated with a very poor prognosis. Oligohydramnios due to chronic placental abruption appears to be an end-stage manifestation of severe uteroplacental insufficiency and is associated with a high risk for intrauterine fetal demise. Given the concern and prognosis associated with significant oligohydramnios at the current gestational age, the couple expressed that they are not interested in pursuing additional screening or testing related to possible underlying etiologies for cleft lip.   [name] was provided with written information regarding cystic fibrosis (CF), spinal muscular atrophy (SMA) and hemoglobinopathies including the carrier frequency, availability of carrier screening and prenatal diagnosis if indicated.  In addition, we discussed that  CF and hemoglobinopathies are routinely screened for as part of the [place] newborn screening panel.  This was not discussed further at this time, given the nature of today's discussion.   The couple reported no known family history for birth defects, intellectual disability, and known genetic conditions.  Detailed family histories were not reviewed at this time. Without further information regarding the provided family history, an accurate genetic risk cannot be calculated. Further genetic counseling is warranted if more information is obtained.  I counseled this couple for approximately 20 minutes regarding the above risks and available options.    Quinn PlowmanKaren Caia Lofaro, MS,  Certified Genetic Counselor 03/19/2016

## 2016-03-25 ENCOUNTER — Ambulatory Visit (INDEPENDENT_AMBULATORY_CARE_PROVIDER_SITE_OTHER): Payer: Medicaid Other | Admitting: Obstetrics & Gynecology

## 2016-03-25 ENCOUNTER — Inpatient Hospital Stay (HOSPITAL_COMMUNITY)
Admission: AD | Admit: 2016-03-25 | Discharge: 2016-03-25 | Disposition: A | Discharge status: Home / Self Care | Payer: Medicaid Other | Source: Ambulatory Visit | Attending: Obstetrics and Gynecology | Admitting: Obstetrics and Gynecology

## 2016-03-25 ENCOUNTER — Encounter (HOSPITAL_COMMUNITY): Payer: Self-pay

## 2016-03-25 ENCOUNTER — Ambulatory Visit: Payer: Medicaid Other | Admitting: Clinical

## 2016-03-25 VITALS — BP 153/103 | HR 105 | Wt 264.7 lb

## 2016-03-25 DIAGNOSIS — O162 Unspecified maternal hypertension, second trimester: Secondary | ICD-10-CM

## 2016-03-25 DIAGNOSIS — O42112 Preterm premature rupture of membranes, onset of labor more than 24 hours following rupture, second trimester: Secondary | ICD-10-CM | POA: Diagnosis present

## 2016-03-25 DIAGNOSIS — O42912 Preterm premature rupture of membranes, unspecified as to length of time between rupture and onset of labor, second trimester: Secondary | ICD-10-CM | POA: Diagnosis not present

## 2016-03-25 DIAGNOSIS — O0992 Supervision of high risk pregnancy, unspecified, second trimester: Secondary | ICD-10-CM

## 2016-03-25 DIAGNOSIS — O358XX0 Maternal care for other (suspected) fetal abnormality and damage, not applicable or unspecified: Secondary | ICD-10-CM | POA: Diagnosis not present

## 2016-03-25 DIAGNOSIS — O10012 Pre-existing essential hypertension complicating pregnancy, second trimester: Secondary | ICD-10-CM | POA: Diagnosis not present

## 2016-03-25 DIAGNOSIS — O10919 Unspecified pre-existing hypertension complicating pregnancy, unspecified trimester: Secondary | ICD-10-CM

## 2016-03-25 DIAGNOSIS — F4329 Adjustment disorder with other symptoms: Secondary | ICD-10-CM

## 2016-03-25 DIAGNOSIS — O4200 Premature rupture of membranes, onset of labor within 24 hours of rupture, unspecified weeks of gestation: Secondary | ICD-10-CM

## 2016-03-25 DIAGNOSIS — Z3A22 22 weeks gestation of pregnancy: Secondary | ICD-10-CM | POA: Insufficient documentation

## 2016-03-25 DIAGNOSIS — O359XX1 Maternal care for (suspected) fetal abnormality and damage, unspecified, fetus 1: Secondary | ICD-10-CM

## 2016-03-25 DIAGNOSIS — O4210 Premature rupture of membranes, onset of labor more than 24 hours following rupture, unspecified weeks of gestation: Secondary | ICD-10-CM

## 2016-03-25 DIAGNOSIS — R030 Elevated blood-pressure reading, without diagnosis of hypertension: Secondary | ICD-10-CM | POA: Diagnosis present

## 2016-03-25 LAB — PROTEIN / CREATININE RATIO, URINE
CREATININE, URINE: 33 mg/dL
Total Protein, Urine: <6 mg/dL

## 2016-03-25 LAB — COMPREHENSIVE METABOLIC PANEL
ALT: 13 U/L — ABNORMAL LOW (ref 14–54)
ANION GAP: 7 (ref 5–15)
AST: 15 U/L (ref 15–41)
Albumin: 3.2 g/dL — ABNORMAL LOW (ref 3.5–5.0)
Alkaline Phosphatase: 57 U/L (ref 38–126)
BILIRUBIN TOTAL: 0.3 mg/dL (ref 0.3–1.2)
CHLORIDE: 107 mmol/L (ref 101–111)
CO2: 21 mmol/L — ABNORMAL LOW (ref 22–32)
Calcium: 9.2 mg/dL (ref 8.9–10.3)
Creatinine, Ser: 0.43 mg/dL — ABNORMAL LOW (ref 0.44–1.00)
Glucose, Bld: 101 mg/dL — ABNORMAL HIGH (ref 65–99)
POTASSIUM: 3.8 mmol/L (ref 3.5–5.1)
Sodium: 135 mmol/L (ref 135–145)
TOTAL PROTEIN: 6.6 g/dL (ref 6.5–8.1)

## 2016-03-25 LAB — POCT URINALYSIS DIP (DEVICE)
BILIRUBIN URINE: NEGATIVE
Glucose, UA: 100 mg/dL — AB
Ketones, ur: 15 mg/dL — AB
LEUKOCYTES UA: NEGATIVE
NITRITE: NEGATIVE
Protein, ur: NEGATIVE mg/dL
Specific Gravity, Urine: 1.02 (ref 1.005–1.030)
Urobilinogen, UA: 0.2 mg/dL (ref 0.0–1.0)
pH: 5.5 (ref 5.0–8.0)

## 2016-03-25 LAB — CBC
HCT: 37.4 % (ref 36.0–46.0)
Hemoglobin: 13.1 g/dL (ref 12.0–15.0)
MCH: 31.5 pg (ref 26.0–34.0)
MCHC: 35 g/dL (ref 30.0–36.0)
MCV: 89.9 fL (ref 78.0–100.0)
PLATELETS: 232 10*3/uL (ref 150–400)
RBC: 4.16 MIL/uL (ref 3.87–5.11)
RDW: 13.7 % (ref 11.5–15.5)
WBC: 9 10*3/uL (ref 4.0–10.5)

## 2016-03-25 MED ORDER — LABETALOL HCL 200 MG PO TABS
200.0000 mg | ORAL_TABLET | Freq: Two times a day (BID) | ORAL | Status: DC
Start: 2016-03-25 — End: 2016-06-09

## 2016-03-25 MED ORDER — LABETALOL HCL 100 MG PO TABS
200.0000 mg | ORAL_TABLET | Freq: Two times a day (BID) | ORAL | Status: DC
  Administered 2016-03-25: 200 mg via ORAL
  Filled 2016-03-25: qty 2

## 2016-03-25 NOTE — Progress Notes (Signed)
    Subjective:  Amanda [name] is a 25 y.o. G3P0020 at 4012w4d being seen today for ongoing prenatal care.  She is currently monitored for the following issues for this high-risk pregnancy and has Preterm premature rupture of membranes in second trimester; Supervision of high-risk pregnancy; Elevated MSAFP (maternal serum alpha-fetoprotein); and Suspected cleft lip, antepartum on her problem list.  Patient reports bleeding.  Contractions: Irregular. Vag. Bleeding: Moderate.  Movement: Absent. Denies leaking of fluid.   The following portions of the patient's history were reviewed and updated as appropriate: allergies, current medications, past family history, past medical history, past social history, past surgical history and problem list. Problem list updated.  Objective:   Filed Vitals:   03/25/16 1114  BP: 150/104  Pulse: 105  Weight: 264 lb 11.2 oz (120.067 kg)    Fetal Status:     Movement: Absent     General:  Alert, oriented and cooperative. Patient is in no acute distress.  Skin: Skin is warm and dry. No rash noted.   Cardiovascular: Normal heart rate noted  Respiratory: Normal respiratory effort, no problems with respiration noted  Abdomen: Soft, gravid, appropriate for gestational age. Pain/Pressure: Present     Pelvic:  Cervical exam deferred      Speculum exam no bleeding noted and cervix appears normal  Extremities: Normal range of motion.  Edema: None  Mental Status: Normal mood and affect. Normal behavior. Normal judgment and thought content.   Urinalysis:      Assessment and Plan:  Pregnancy: G3P0020 at 2712w4d  1. Supervision of high-risk pregnancy, second trimester Elevated BP noted and needs evaluation for preeclampsia vs CHTN  2. Preterm premature rupture of membranes with onset of labor more than 24 hours following rupture in second trimester Plan to admit 23 week for steroid and antibiotics  Preterm labor symptoms and general obstetric precautions including  but not limited to vaginal bleeding, contractions, leaking of fluid and fetal movement were reviewed in detail with the patient. Please refer to After Visit Summary for other counseling recommendations.  23 week admission planned  Adam PhenixJames G Bobbyjoe Pabst, MD

## 2016-03-25 NOTE — Discharge Instructions (Signed)

## 2016-03-25 NOTE — MAU Provider Note (Signed)
History     CSN: [id number]  Arrival date and time: 03/25/16 1208   First Provider Initiated Contact with Patient 03/25/16 1334      No chief complaint on file.  HPI Comments: G3P0020 @22 .4 wks sent from clinic for elevated BPs. No visual disturbances or epigastric pain but reports occ HA initiated by anxiety. She does not use any meds for the HA, and reports resolution with rest. No fevers. No ctx or cramping. She reports some pink tinged LOF (PPROM). Pregnancy complicated by PPROM at 18 wks, elevated MSAFP (maternal serum alpha-fetoprotein); and suspected cleft lip. Review of records indicate a few episodes of elevated BP prior to 20 wks.   OB History    Gravida Para Term Preterm AB TAB SAB Ectopic Multiple Living   3 0 0 0 2 0 2 0 0 0       Past Medical History  Diagnosis Date  . Celiac disease   . Polycystic ovarian syndrome     Past Surgical History  Procedure Laterality Date  . Tonsillectomy    . Appendectomy    . Foot surgery       History reviewed. No pertinent family history.  Social History  Substance Use Topics  . Smoking status: Never Smoker   . Smokeless tobacco: Never Used  . Alcohol Use: No    Allergies:  Allergies  Allergen Reactions  . Gluten Meal Diarrhea    Celiac disease    Prescriptions prior to admission  Medication Sig Dispense Refill Last Dose  . Ascorbic Acid (VITAMIN C) 1000 MG tablet Take 1,000 mg by mouth daily.   03/25/2016 at Unknown time  . magnesium 30 MG tablet Take 30 mg by mouth 2 (two) times daily.   03/25/2016 at Unknown time  . Prenat-FeFum-FePo-FA-Omega 3 (CONCEPT DHA) 53.5-38-1 MG CAPS Take 1 capsule by mouth daily.   03/25/2016 at Unknown time    Review of Systems  Constitutional: Negative.   HENT: Negative.   Eyes: Negative.   Respiratory: Negative.   Cardiovascular: Negative.   Gastrointestinal: Negative.   Genitourinary: Negative.   Musculoskeletal: Negative.   Skin: Negative.   Neurological: Negative.    Endo/Heme/Allergies: Negative.   Psychiatric/Behavioral: Negative.    Physical Exam   Blood pressure 127/89, pulse 90, temperature 98.4 F (36.9 C), temperature source Oral, resp. rate 18, last menstrual period 10/12/2015. BP range: 150-160s/90-110s Physical Exam  Constitutional: She is oriented to person, place, and time. She appears well-developed and well-nourished.  HENT:  Head: Normocephalic and atraumatic.  Neck: Normal range of motion. Neck supple.  Cardiovascular: Normal rate and regular rhythm.   Respiratory: Effort normal and breath sounds normal.  GI: Soft. Bowel sounds are normal.  Genitourinary:  deferred  Musculoskeletal: Normal range of motion.  Neurological: She is alert and oriented to person, place, and time.  Skin: Skin is warm and dry.  Psychiatric: She has a normal mood and affect.  FHT: 140 bpm  MAU Course  Procedures Results for orders placed or performed during the hospital encounter of 03/25/16 (from the past 24 hour(s))  Protein / creatinine ratio, urine     Status: None   Collection Time: 03/25/16 12:08 PM  Result Value Ref Range   Creatinine, Urine 33.00 mg/dL   Total Protein, Urine <6 mg/dL   Protein Creatinine Ratio        0.00 - 0.15 mg/mg[Cre]  CBC     Status: None   Collection Time: 03/25/16 12:21 PM  Result Value Ref Range  WBC 9.0 4.0 - 10.5 K/uL   RBC 4.16 3.87 - 5.11 MIL/uL   Hemoglobin 13.1 12.0 - 15.0 g/dL   HCT 14.737.4 82.936.0 - 56.246.0 %   MCV 89.9 78.0 - 100.0 fL   MCH 31.5 26.0 - 34.0 pg   MCHC 35.0 30.0 - 36.0 g/dL   RDW 13.013.7 86.511.5 - 78.415.5 %   Platelets 232 150 - 400 K/uL  Comprehensive metabolic panel     Status: Abnormal   Collection Time: 03/25/16 12:21 PM  Result Value Ref Range   Sodium 135 135 - 145 mmol/L   Potassium 3.8 3.5 - 5.1 mmol/L   Chloride 107 101 - 111 mmol/L   CO2 21 (L) 22 - 32 mmol/L   Glucose, Bld 101 (H) 65 - 99 mg/dL   BUN <5 (L) 6 - 20 mg/dL   Creatinine, Ser 6.960.43 (L) 0.44 - 1.00 mg/dL   Calcium 9.2 8.9  - 29.510.3 mg/dL   Total Protein 6.6 6.5 - 8.1 g/dL   Albumin 3.2 (L) 3.5 - 5.0 g/dL   AST 15 15 - 41 U/L   ALT 13 (L) 14 - 54 U/L   Alkaline Phosphatase 57 38 - 126 U/L   Total Bilirubin 0.3 0.3 - 1.2 mg/dL   GFR calc non Af Amer >60 >60 mL/min   GFR calc Af Amer >60 >60 mL/min   Anion gap 7 5 - 15    MDM Labs ordered and reviewed. No evidence of Pre-e, likely chronic HTN. Stable for discharge home.  Assessment and Plan   1. Suspected fetal anomaly, antepartum, fetus 1   2. Supervision of high-risk pregnancy, second trimester   3. Chronic hypertension during pregnancy, antepartum   4. Prolonged premature rupture of membranes    Discharge home Labetalol 200mg  po bid Pre-e precautions [facility] as scheduled  Donette LarryMelanie Kataleia Quaranta, CNM 03/25/2016, 1:54 PM

## 2016-03-25 NOTE — Progress Notes (Signed)
  ASSESSMENT: Pt currently experiencing Stress and adjustment reaction.Pt needs to f/u with OB. Pt would benefit from further counseling at next visit, along with resources to cope with current life stressors. Stage of Change: contemplative  PLAN: 1. Consider f/u with behavioral health clinician in as needed 2. Psychiatric Medications: none   SUBJECTIVE: Pt. referred by Dr Macon LargeAnyanwu for counseling after early rupture of membranes at 18 wks Pt. reports the following symptoms/concerns: Pt does not wish to talk today, has hospital appointment in three days; may come back to talk further at next visit.  Duration of problem: One month Severity: mild   OBJECTIVE: Orientation & Cognition: Oriented x3. Thought processes normal and appropriate to situation. Mood: teary Affect: appropriate Appearance: appropriate Risk of harm to self or others: no known risk of harm to self or others Substance use: none Assessments administered: PHQ9: 0/ GAD7: 0  -------------------------------------------- Other(s) present in the room: none  Time spent with patient in exam room: 5  minutes   Depression screen [facility] 03/25/2016  Decreased Interest 0  Down, Depressed, Hopeless 0  PHQ - 2 Score 0  Altered sleeping 0  Tired, decreased energy 0  Change in appetite 0  Feeling bad or failure about yourself  0  Trouble concentrating 0  Moving slowly or fidgety/restless 0  Suicidal thoughts 0  PHQ-9 Score 0   GAD 7 : Generalized Anxiety Score 03/25/2016  Nervous, Anxious, on Edge 0  Control/stop worrying 0  Worry too much - different things 0  Trouble relaxing 0  Restless 0  Easily annoyed or irritable 0  Afraid - awful might happen 0  Total GAD 7 Score 0

## 2016-03-25 NOTE — Patient Instructions (Signed)

## 2016-03-25 NOTE — Progress Notes (Signed)
States on 03/23/16 had a cookout and after that started bleeding- bright red at first and pinkish red now. C/o cramping and back pain.  Report called to Antenatal Unit charge nurse Renee  for direct admit on 03/28/16 when patient reaches [redacted] weeks gestation. Also notified Admitting .   [facility] Isabel CapriceKathy Wilson  for admission to MAU for monitoring of elevated bp today.  Patient taken up to MAU.

## 2016-03-28 ENCOUNTER — Encounter (HOSPITAL_COMMUNITY): Payer: Self-pay | Admitting: *Deleted

## 2016-03-28 ENCOUNTER — Inpatient Hospital Stay (HOSPITAL_COMMUNITY)
Admission: AD | Admit: 2016-03-28 | Discharge: 2016-04-16 | DRG: 765 | Disposition: A | Discharge status: Home / Self Care | Payer: Medicaid Other | Source: Ambulatory Visit | Attending: Obstetrics & Gynecology | Admitting: Obstetrics & Gynecology

## 2016-03-28 DIAGNOSIS — Z3A23 23 weeks gestation of pregnancy: Secondary | ICD-10-CM

## 2016-03-28 DIAGNOSIS — O164 Unspecified maternal hypertension, complicating childbirth: Secondary | ICD-10-CM | POA: Diagnosis present

## 2016-03-28 DIAGNOSIS — K8681 Exocrine pancreatic insufficiency: Secondary | ICD-10-CM | POA: Diagnosis present

## 2016-03-28 DIAGNOSIS — O42919 Preterm premature rupture of membranes, unspecified as to length of time between rupture and onset of labor, unspecified trimester: Secondary | ICD-10-CM | POA: Diagnosis present

## 2016-03-28 DIAGNOSIS — Z6841 Body Mass Index (BMI) 40.0 and over, adult: Secondary | ICD-10-CM | POA: Diagnosis not present

## 2016-03-28 DIAGNOSIS — O321XX0 Maternal care for breech presentation, not applicable or unspecified: Secondary | ICD-10-CM | POA: Diagnosis not present

## 2016-03-28 DIAGNOSIS — O328XX0 Maternal care for other malpresentation of fetus, not applicable or unspecified: Secondary | ICD-10-CM | POA: Diagnosis present

## 2016-03-28 DIAGNOSIS — O9962 Diseases of the digestive system complicating childbirth: Secondary | ICD-10-CM | POA: Diagnosis present

## 2016-03-28 DIAGNOSIS — O359XX0 Maternal care for (suspected) fetal abnormality and damage, unspecified, not applicable or unspecified: Secondary | ICD-10-CM | POA: Diagnosis present

## 2016-03-28 DIAGNOSIS — Z3A22 22 weeks gestation of pregnancy: Secondary | ICD-10-CM | POA: Diagnosis not present

## 2016-03-28 DIAGNOSIS — O162 Unspecified maternal hypertension, second trimester: Secondary | ICD-10-CM | POA: Diagnosis present

## 2016-03-28 DIAGNOSIS — O42112 Preterm premature rupture of membranes, onset of labor more than 24 hours following rupture, second trimester: Secondary | ICD-10-CM | POA: Diagnosis present

## 2016-03-28 DIAGNOSIS — K900 Celiac disease: Secondary | ICD-10-CM | POA: Diagnosis present

## 2016-03-28 DIAGNOSIS — Z3A25 25 weeks gestation of pregnancy: Secondary | ICD-10-CM | POA: Diagnosis not present

## 2016-03-28 DIAGNOSIS — O42912 Preterm premature rupture of membranes, unspecified as to length of time between rupture and onset of labor, second trimester: Secondary | ICD-10-CM | POA: Diagnosis present

## 2016-03-28 DIAGNOSIS — O411220 Chorioamnionitis, second trimester, not applicable or unspecified: Secondary | ICD-10-CM | POA: Diagnosis present

## 2016-03-28 DIAGNOSIS — O0990 Supervision of high risk pregnancy, unspecified, unspecified trimester: Secondary | ICD-10-CM

## 2016-03-28 DIAGNOSIS — O99824 Streptococcus B carrier state complicating childbirth: Secondary | ICD-10-CM | POA: Diagnosis present

## 2016-03-28 DIAGNOSIS — O280 Abnormal hematological finding on antenatal screening of mother: Secondary | ICD-10-CM | POA: Diagnosis present

## 2016-03-28 DIAGNOSIS — Z3A24 24 weeks gestation of pregnancy: Secondary | ICD-10-CM | POA: Diagnosis not present

## 2016-03-28 DIAGNOSIS — O99210 Obesity complicating pregnancy, unspecified trimester: Secondary | ICD-10-CM | POA: Diagnosis present

## 2016-03-28 MED ORDER — SENNOSIDES-DOCUSATE SODIUM 8.6-50 MG PO TABS
1.0000 | ORAL_TABLET | Freq: Two times a day (BID) | ORAL | Status: DC
  Administered 2016-03-28 – 2016-04-16 (×35): 1 via ORAL
  Filled 2016-03-28 (×36): qty 1

## 2016-03-28 MED ORDER — MAGNESIUM 30 MG PO TABS: 30.0000 mg | ORAL_TABLET | Freq: Two times a day (BID) | ORAL | Status: DC

## 2016-03-28 MED ORDER — CONCEPT DHA 53.5-38-1 MG PO CAPS: 1.0000 | ORAL_CAPSULE | Freq: Every day | ORAL | Status: DC

## 2016-03-28 MED ORDER — SODIUM CHLORIDE 0.9 % IV SOLN
500.0000 mg | Freq: Four times a day (QID) | INTRAVENOUS | Status: AC
  Administered 2016-03-28 – 2016-03-30 (×8): 500 mg via INTRAVENOUS
  Filled 2016-03-28 (×8): qty 10

## 2016-03-28 MED ORDER — ASPIRIN 81 MG PO CHEW
81.0000 mg | CHEWABLE_TABLET | Freq: Every day | ORAL | Status: DC
  Administered 2016-03-28 – 2016-04-16 (×19): 81 mg via ORAL
  Filled 2016-03-28 (×21): qty 1

## 2016-03-28 MED ORDER — PRENATAL MULTIVITAMIN CH
1.0000 | ORAL_TABLET | Freq: Every day | ORAL | Status: DC
  Administered 2016-03-29 – 2016-04-16 (×19): 1 via ORAL
  Filled 2016-03-28 (×18): qty 1

## 2016-03-28 MED ORDER — CALCIUM CARBONATE ANTACID 500 MG PO CHEW: 2.0000 | CHEWABLE_TABLET | ORAL | Status: DC | PRN

## 2016-03-28 MED ORDER — AMOXICILLIN 500 MG PO CAPS
500.0000 mg | ORAL_CAPSULE | Freq: Three times a day (TID) | ORAL | Status: AC
Start: 2016-03-30 — End: 2016-04-04
  Administered 2016-03-30 – 2016-04-04 (×15): 500 mg via ORAL
  Filled 2016-03-28 (×15): qty 1

## 2016-03-28 MED ORDER — DOCUSATE SODIUM 100 MG PO CAPS
100.0000 mg | ORAL_CAPSULE | Freq: Every day | ORAL | Status: DC
  Administered 2016-03-28 – 2016-04-16 (×19): 100 mg via ORAL
  Filled 2016-03-28 (×20): qty 1

## 2016-03-28 MED ORDER — ACETAMINOPHEN 325 MG PO TABS
650.0000 mg | ORAL_TABLET | ORAL | Status: DC | PRN
  Administered 2016-04-13: 650 mg via ORAL
  Filled 2016-03-28 (×2): qty 2

## 2016-03-28 MED ORDER — MAGNESIUM OXIDE 400 (241.3 MG) MG PO TABS
200.0000 mg | ORAL_TABLET | Freq: Every day | ORAL | Status: DC
  Administered 2016-03-29 – 2016-04-16 (×18): 200 mg via ORAL
  Filled 2016-03-28 (×21): qty 0.5

## 2016-03-28 MED ORDER — VITAMIN C 500 MG PO TABS
1000.0000 mg | ORAL_TABLET | Freq: Every day | ORAL | Status: DC
  Administered 2016-03-29 – 2016-04-16 (×18): 1000 mg via ORAL
  Filled 2016-03-28 (×21): qty 2

## 2016-03-28 MED ORDER — ZOLPIDEM TARTRATE 5 MG PO TABS: 5.0000 mg | ORAL_TABLET | Freq: Every evening | ORAL | Status: DC | PRN

## 2016-03-28 MED ORDER — ERYTHROMYCIN BASE 250 MG PO TABS
500.0000 mg | ORAL_TABLET | Freq: Four times a day (QID) | ORAL | Status: AC
  Administered 2016-03-30 – 2016-04-04 (×20): 500 mg via ORAL
  Filled 2016-03-28 (×21): qty 2

## 2016-03-28 MED ORDER — SODIUM CHLORIDE 0.9 % IV SOLN
2.0000 g | Freq: Four times a day (QID) | INTRAVENOUS | Status: AC
  Administered 2016-03-28 – 2016-03-30 (×8): 2 g via INTRAVENOUS
  Filled 2016-03-28 (×8): qty 2000

## 2016-03-28 MED ORDER — LABETALOL HCL 200 MG PO TABS
200.0000 mg | ORAL_TABLET | Freq: Two times a day (BID) | ORAL | Status: DC
  Administered 2016-03-28 – 2016-04-16 (×35): 200 mg via ORAL
  Filled 2016-03-28 (×2): qty 2
  Filled 2016-03-28: qty 1
  Filled 2016-03-28 (×15): qty 2
  Filled 2016-03-28: qty 1
  Filled 2016-03-28 (×17): qty 2

## 2016-03-28 MED ORDER — BETAMETHASONE SOD PHOS & ACET 6 (3-3) MG/ML IJ SUSP
12.0000 mg | INTRAMUSCULAR | Status: AC
  Administered 2016-03-28 – 2016-03-29 (×2): 12 mg via INTRAMUSCULAR
  Filled 2016-03-28 (×2): qty 2

## 2016-03-28 NOTE — H&P (Signed)
Preoperative History and Physical  Amanda [name] is a 25 y.o. G3P0020 with Patient's last menstrual period was 10/12/2015. admitted for a inpatient observation due to PPROM diagnosed on 02/25/2016(18 weeks).   Sonogram 03/18/2016 [facility]  Subjective:  Amanda [name] is a 25 y.o. G3P0020 at 7337w4d being seen today for ongoing prenatal care. She is currently monitored for the following issues for this high-risk pregnancy and has Preterm premature rupture of membranes in second trimester; Supervision of high-risk pregnancy; Elevated MSAFP (maternal serum alpha-fetoprotein); and Suspected cleft lip, antepartum on her problem list.  Patient reports bleeding. Contractions: Irregular. Vag. Bleeding: Moderate. Movement: Absent. Denies leaking of fluid.   The following portions of the patient's history were reviewed and updated as appropriate: allergies, current medications, past family history, past medical history, past social history, past surgical history and problem list. Problem list updated.       PMH:    Past Medical History  Diagnosis Date  . Celiac disease   . Polycystic ovarian syndrome     PSH:     Past Surgical History  Procedure Laterality Date  . Tonsillectomy    . Appendectomy    . Foot surgery       POb/GynH:      OB History    Gravida Para Term Preterm AB TAB SAB Ectopic Multiple Living   3 0 0 0 2 0 2 0 0 0       SH:   Social History  Substance Use Topics  . Smoking status: Never Smoker   . Smokeless tobacco: Never Used  . Alcohol Use: No    FH:   History reviewed. No pertinent family history.   Allergies:  Allergies  Allergen Reactions  . Gluten Meal Diarrhea    Celiac disease    Medications:       Current facility-administered medications:  .  acetaminophen (TYLENOL) tablet 650 mg, 650 mg, Oral, Q4H PRN, Lazaro ArmsLuther H Eure, MD .  ampicillin (OMNIPEN) 2 g in sodium chloride 0.9 % 50 mL IVPB, 2 g, Intravenous, Q6H **FOLLOWED BY** [START ON 03/30/2016]  amoxicillin (AMOXIL) capsule 500 mg, 500 mg, Oral, Q8H, Lazaro ArmsLuther H Eure, MD .  aspirin chewable tablet 81 mg, 81 mg, Oral, Daily, Lazaro ArmsLuther H Eure, MD .  betamethasone acetate-betamethasone sodium phosphate (CELESTONE) injection 12 mg, 12 mg, Intramuscular, Q24H, Lazaro ArmsLuther H Eure, MD .  calcium carbonate (TUMS - dosed in mg elemental calcium) chewable tablet 400 mg of elemental calcium, 2 tablet, Oral, Q4H PRN, Lazaro ArmsLuther H Eure, MD .  CONCEPT DHA 53.5-38-1 MG CAPS 1 capsule, 1 capsule, Oral, Daily, Lazaro ArmsLuther H Eure, MD .  docusate sodium (COLACE) capsule 100 mg, 100 mg, Oral, Daily, Lazaro ArmsLuther H Eure, MD .  erythromycin 500 mg in sodium chloride 0.9 % 100 mL IVPB, 500 mg, Intravenous, Q6H **FOLLOWED BY** [START ON 03/30/2016] erythromycin (E-MYCIN) tablet 500 mg, 500 mg, Oral, Q6H, Lazaro ArmsLuther H Eure, MD .  labetalol (NORMODYNE) tablet 200 mg, 200 mg, Oral, BID, Lazaro ArmsLuther H Eure, MD .  magnesium tablet 30 mg, 30 mg, Oral, BID, Lazaro ArmsLuther H Eure, MD .  prenatal multivitamin tablet 1 tablet, 1 tablet, Oral, Q1200, Lazaro ArmsLuther H Eure, MD .  senna-docusate (Senokot-S) tablet 1 tablet, 1 tablet, Oral, BID, Lazaro ArmsLuther H Eure, MD .  vitamin C (ASCORBIC ACID) tablet 1,000 mg, 1,000 mg, Oral, Daily, Lazaro ArmsLuther H Eure, MD .  zolpidem (AMBIEN) tablet 5 mg, 5 mg, Oral, QHS PRN, Lazaro ArmsLuther H Eure, MD  Review of Systems:   Review of Systems  Constitutional: Negative for fever, chills, weight loss, malaise/fatigue and diaphoresis.  HENT: Negative for hearing loss, ear pain, nosebleeds, congestion, sore throat, neck pain, tinnitus and ear discharge.   Eyes: Negative for blurred vision, double vision, photophobia, pain, discharge and redness.  Respiratory: Negative for cough, hemoptysis, sputum production, shortness of breath, wheezing and stridor.   Cardiovascular: Negative for chest pain, palpitations, orthopnea, claudication, leg swelling and PND.  Gastrointestinal: Positive for abdominal pain. Negative for heartburn, nausea, vomiting, diarrhea,  constipation, blood in stool and melena.  Genitourinary: Negative for dysuria, urgency, frequency, hematuria and flank pain.  Musculoskeletal: Negative for myalgias, back pain, joint pain and falls.  Skin: Negative for itching and rash.  Neurological: Negative for dizziness, tingling, tremors, sensory change, speech change, focal weakness, seizures, loss of consciousness, weakness and headaches.  Endo/Heme/Allergies: Negative for environmental allergies and polydipsia. Does not bruise/bleed easily.  Psychiatric/Behavioral: Negative for depression, suicidal ideas, hallucinations, memory loss and substance abuse. The patient is not nervous/anxious and does not have insomnia.      PHYSICAL EXAM:  Height  (1.549 m), weight 264 lb (119.75 kg), last menstrual period 10/12/2015.    Vitals reviewed. Constitutional: She is oriented to person, place, and time. She appears well-developed and well-nourished.  HENT:  Head: Normocephalic and atraumatic.  Right Ear: External ear normal.  Left Ear: External ear normal.  Nose: Nose normal.  Mouth/Throat: Oropharynx is clear and moist.  Eyes: Conjunctivae and EOM are normal. Pupils are equal, round, and reactive to light. Right eye exhibits no discharge. Left eye exhibits no discharge. No scleral icterus.  Neck: Normal range of motion. Neck supple. No tracheal deviation present. No thyromegaly present.  Cardiovascular: Normal rate, regular rhythm, normal heart sounds and intact distal pulses.  Exam reveals no gallop and no friction rub.   No murmur heard. Respiratory: Effort normal and breath sounds normal. No respiratory distress. She has no wheezes. She has no rales. She exhibits no tenderness.  GI: Soft. Bowel sounds are normal. She exhibits no distension and no mass. There is tenderness. There is no rebound and no guarding.  Genitourinary:       Vulva is normal without lesions Vagina is pink moist without discharge Cervix not  examined Musculoskeletal: Normal range of motion. She exhibits no edema and no tenderness.  Neurological: She is alert and oriented to person, place, and time. She has normal reflexes. She displays normal reflexes. No cranial nerve deficit. She exhibits normal muscle tone. Coordination normal.  Skin: Skin is warm and dry. No rash noted. No erythema. No pallor.  Psychiatric: She has a normal mood and affect. Her behavior is normal. Judgment and thought content normal.    Labs: Results for orders placed or performed during the hospital encounter of 03/25/16 (from the past 336 hour(s))  Protein / creatinine ratio, urine   Collection Time: 03/25/16 12:08 PM  Result Value Ref Range   Creatinine, Urine 33.00 mg/dL   Total Protein, Urine <6 mg/dL   Protein Creatinine Ratio        0.00 - 0.15 mg/mg[Cre]  CBC   Collection Time: 03/25/16 12:21 PM  Result Value Ref Range   WBC 9.0 4.0 - 10.5 K/uL   RBC 4.16 3.87 - 5.11 MIL/uL   Hemoglobin 13.1 12.0 - 15.0 g/dL   HCT 11.9 14.7 - 82.9 %   MCV 89.9 78.0 - 100.0 fL   MCH 31.5 26.0 - 34.0 pg   MCHC 35.0 30.0 - 36.0 g/dL   RDW 13.7  11.5 - 15.5 %   Platelets 232 150 - 400 K/uL  Comprehensive metabolic panel   Collection Time: 03/25/16 12:21 PM  Result Value Ref Range   Sodium 135 135 - 145 mmol/L   Potassium 3.8 3.5 - 5.1 mmol/L   Chloride 107 101 - 111 mmol/L   CO2 21 (L) 22 - 32 mmol/L   Glucose, Bld 101 (H) 65 - 99 mg/dL   BUN <5 (L) 6 - 20 mg/dL   Creatinine, Ser 1.61 (L) 0.44 - 1.00 mg/dL   Calcium 9.2 8.9 - 09.6 mg/dL   Total Protein 6.6 6.5 - 8.1 g/dL   Albumin 3.2 (L) 3.5 - 5.0 g/dL   AST 15 15 - 41 U/L   ALT 13 (L) 14 - 54 U/L   Alkaline Phosphatase 57 38 - 126 U/L   Total Bilirubin 0.3 0.3 - 1.2 mg/dL   GFR calc non Af Amer >60 >60 mL/min   GFR calc Af Amer >60 >60 mL/min   Anion gap 7 5 - 15  Results for orders placed or performed in visit on 03/25/16 (from the past 336 hour(s))  POCT urinalysis dip (device)   Collection  Time: 03/25/16 11:11 AM  Result Value Ref Range   Glucose, UA 100 (A) NEGATIVE mg/dL   Bilirubin Urine NEGATIVE NEGATIVE   Ketones, ur 15 (A) NEGATIVE mg/dL   Specific Gravity, Urine 1.020 1.005 - 1.030   Hgb urine dipstick MODERATE (A) NEGATIVE   pH 5.5 5.0 - 8.0   Protein, ur NEGATIVE NEGATIVE mg/dL   Urobilinogen, UA 0.2 0.0 - 1.0 mg/dL   Nitrite NEGATIVE NEGATIVE   Leukocytes, UA NEGATIVE NEGATIVE    EKG: No orders found for this or any previous visit.  Imaging Studies: [place] Mfm Ob Follow Up  03/18/2016  OBSTETRICAL ULTRASOUND: This exam was performed within a [facility]. The OB US report was generated in the [facility] system, and faxed to the ordering physician.  This report is available in the [employer]. See the [facility] Obstetric US report via the Image Link.     Assessment:  [redacted]w[redacted]d Estimated Date of Delivery: 07/25/16  PPROM on 02/25/2016 Chronic hypertension on labetalol 200 BID, just started Breech by sonogram  Patient Active Problem List   Diagnosis Date Noted  . Preterm premature rupture of membranes (PPROM) with unknown onset of labor 03/28/2016  . Elevated MSAFP (maternal serum alpha-fetoprotein) 03/15/2016  . Suspected cleft lip, antepartum 03/15/2016  . Supervision of high-risk pregnancy 03/11/2016  . Preterm premature rupture of membranes in second trimester 02/27/2016    Plan: In hospital observation with intermittent monitoring IV antibiotics, ampicillin and erythromycin betamehtasone Start baby aspirin, due to chronic hypertension diagnosis NICU consult   EURE,LUTHER H 03/28/2016 2:17 PM

## 2016-03-28 NOTE — Consult Note (Signed)
Neonatology Consult Note:  At the request of the patients obstetrician Dr. Elonda Husky I met with Amanda [name] and her husband.  She is a 25 y.o. G3P0020 with admission due to PPROM diagnosed on 02/25/2016(18 weeks).  Pregnancy also complicated by elevated MSAFP (maternal serum alpha-fetoprotein), chronic HTN, breech malposition and suspected cleft lip.    Her current plan of care includes:in hospital observation with intermittent monitoring, IV antibiotics, ampicillin and erythromycin, betamethasone and baby aspirin due to chronic hypertension diagnosis.    We discussed morbidity/mortality at this gestional age with an emphasis on high likelihood of pulmonary hypoplasia due to prolonged period of rupture.  We discussed delivery room resuscitation, including intubation and surfactant in DR.  Discussed mechanical ventilation and risk for chronic lung disease, risk for IVH with potential for motor / cognitive deficits, ROP, NEC, sepsis, as well as temperature instability and feeding immaturity.  Discussed NG / OG feeds, benefits of MBM in reducing incidence of NEC.    We discussed cleft lip repair as an outpatient.    Discussed likely length of stay.  Thank you for allowing [place] to participate in her care.  Please call with questions.  Higinio Roger, DO  Neonatologist  The total length of face-to-face or floor / unit time for this encounter was 30 minutes.  Counseling and / or coordination of care was greater than fifty percent of the time.

## 2016-03-29 DIAGNOSIS — O42112 Preterm premature rupture of membranes, onset of labor more than 24 hours following rupture, second trimester: Secondary | ICD-10-CM

## 2016-03-29 DIAGNOSIS — Z3A23 23 weeks gestation of pregnancy: Secondary | ICD-10-CM

## 2016-03-29 LAB — PROTEIN, URINE, 24 HOUR
Collection Interval-UPROT: 24 hours
Protein, Urine: <6 mg/dL
Urine Total Volume-UPROT: 2850 mL

## 2016-03-29 MED ORDER — SODIUM CHLORIDE 0.9% FLUSH
3.0000 mL | Freq: Two times a day (BID) | INTRAVENOUS | Status: DC
  Administered 2016-03-29 – 2016-04-14 (×33): 3 mL via INTRAVENOUS

## 2016-03-29 NOTE — Progress Notes (Signed)
Patient ID: Amanda [name], female   DOB: 11/28/1990, 25 y.o.   MRN: [id number]  FACULTY PRACTICE ANTEPARTUM(COMPREHENSIVE) NOTE  Amanda [name] is a 25 y.o. G3P0020 with Estimated Date of Delivery: 07/25/16   By  early ultrasound 5959w1d  who is admitted for P/ PROM on 02/25/2016(18 weeks).    Pt desires Caesarean section if delivery indicated  Fetal presentation is breech. Length of Stay:  1  Days  Date of admission:03/28/2016  Subjective: No complaints Patient reports the fetal movement as active. Patient reports uterine contraction  activity as none. Patient reports  vaginal bleeding as none. Patient describes fluid per vagina as Clear.  Vitals:  Blood pressure 122/62, pulse 88, temperature 98 F (36.7 C), temperature source Oral, resp. rate 16, height 5\' 1"  (1.549 m), weight 264 lb (119.75 kg), last menstrual period 10/12/2015. Filed Vitals:   03/28/16 1303 03/28/16 1533 03/28/16 2055 03/29/16 0239  BP: 138/93 132/82 141/89 122/62  Pulse: 80 86 93 88  Temp: 98 F (36.7 C) 98.2 F (36.8 C) 98 F (36.7 C)   TempSrc: Oral Oral Oral   Resp: 18 18 16    Height:      Weight:       Physical Examination:  General appearance - alert, well appearing, and in no distress Abdomen - soft non tender size equals dates Fundal Height:  size equals dates  Fetal Monitoring:  Baseline: 140 bpm   No decels  Labs:  No results found for this or any previous visit (from the past 24 hour(s)).  Imaging Studies:      Medications:  Scheduled . ampicillin (OMNIPEN) IV  2 g Intravenous Q6H   Followed by  . [START ON 03/30/2016] amoxicillin  500 mg Oral Q8H  . aspirin  81 mg Oral Daily  . betamethasone acetate-betamethasone sodium phosphate  12 mg Intramuscular Q24H  . docusate sodium  100 mg Oral Daily  . erythromycin  500 mg Intravenous Q6H   Followed by  . [START ON 03/30/2016] erythromycin  500 mg Oral Q6H  . labetalol  200 mg Oral BID  . magnesium oxide  200 mg Oral Daily  . prenatal multivitamin   1 tablet Oral Q1200  . senna-docusate  1 tablet Oral BID  . [START ON 03/30/2016] sodium chloride flush  3 mL Intravenous Q12H  . vitamin C  1,000 mg Oral Daily   I have reviewed the patient's current medications.  ASSESSMENT: [id number] 8159w1d Estimated Date of Delivery: 07/25/16  Patient Active Problem List   Diagnosis Date Noted  . Preterm premature rupture of membranes (PPROM) with unknown onset of labor 03/28/2016  . Elevated MSAFP (maternal serum alpha-fetoprotein) 03/15/2016  . Suspected cleft lip, antepartum 03/15/2016  . Supervision of high-risk pregnancy 03/11/2016  . Preterm premature rupture of membranes in second trimester 02/27/2016    PLAN: Neonatology consult has been done Continue ampicillin and erythromycin for 7 day course S/P 1/2 betamethasone Continue in hospital observation  EURE,LUTHER H 03/29/2016,7:40 AM

## 2016-03-30 DIAGNOSIS — O42919 Preterm premature rupture of membranes, unspecified as to length of time between rupture and onset of labor, unspecified trimester: Secondary | ICD-10-CM | POA: Diagnosis not present

## 2016-03-30 LAB — OB RESULTS CONSOLE GBS: STREP GROUP B AG: POSITIVE

## 2016-03-30 LAB — CULTURE, BETA STREP (GROUP B ONLY)

## 2016-03-30 LAB — TYPE AND SCREEN
ABO/RH(D): A POS
Antibody Screen: NEGATIVE

## 2016-03-30 NOTE — Progress Notes (Signed)
Patient ID: Amanda [name], female   DOB: 07/09/1991, 25 y.o.   MRN: [id number] ACULTY PRACTICE ANTEPARTUM COMPREHENSIVE PROGRESS NOTE  Amanda [name] is a 25 y.o. G3P0020 at 9147w2d  who is admitted for PPROM @ 18 weeks.   Fetal presentation is breech. Length of Stay:  2  Days  Subjective: Pt with on complaints Patient reports good fetal movement.  She reports no uterine contractions and no bleeding but, does reports continued leakage of clear fluid per vagina.  Vitals:  Blood pressure 118/61, pulse 91, temperature 98 F (36.7 C), temperature source Oral, resp. rate 18, height 5\' 1"  (1.549 m), weight 264 lb (119.75 kg), last menstrual period 10/12/2015. Physical Examination: General appearance - alert, well appearing, and in no distress Abdomen - soft, nontender, nondistended, no masses or organomegaly gravid Extremities - no pedal edema noted, feet normal, good pulses, normal color, temperature and sensation Cervical Exam: Not evaluated.  Membranes:ruptured  Fetal Monitoring:  Baseline: 140's bpm, Variability: Good {> 6 bpm), Accelerations: Non-reactive but appropriate for gestational age and Decelerations: Absent  Labs:  No results found for this or any previous visit (from the past 24 hour(s)).  Imaging Studies:    None pending at present   Medications:  Scheduled . ampicillin (OMNIPEN) IV  2 g Intravenous Q6H   Followed by  . amoxicillin  500 mg Oral Q8H  . aspirin  81 mg Oral Daily  . docusate sodium  100 mg Oral Daily  . erythromycin  500 mg Intravenous Q6H   Followed by  . erythromycin  500 mg Oral Q6H  . labetalol  200 mg Oral BID  . magnesium oxide  200 mg Oral Daily  . prenatal multivitamin  1 tablet Oral Q1200  . senna-docusate  1 tablet Oral BID  . sodium chloride flush  3 mL Intravenous Q12H  . vitamin C  1,000 mg Oral Daily   I have reviewed the patient's current medications.  ASSESSMENT: Patient Active Problem List   Diagnosis Date Noted  . Preterm premature  rupture of membranes (PPROM) with unknown onset of labor 03/28/2016  . Elevated MSAFP (maternal serum alpha-fetoprotein) 03/15/2016  . Suspected cleft lip, antepartum 03/15/2016  . Supervision of high-risk pregnancy 03/11/2016  . Preterm premature rupture of membranes in second trimester 02/27/2016    PLAN: Continue latency antibiotics Deliver for s/sx of infection or other adverse conditions of mother or fetus or [redacted] weeks gestation Pt s/p course of BMZ Continue routine antenatal care. Patient desires c-section for delivery if continued breech at that time.  I have reviewed with the risk of pulmonary hypoplasia that cannot be dx'd until post delivery which could reuslt in the inability to ventilate the infant. Patient reports that she understands    HARRAWAY-SMITH, Ruhan Borak 03/30/2016,7:29 AM

## 2016-03-30 NOTE — Progress Notes (Signed)
I received a referral from RN due to pt's complicated pregnancy.  Amanda [name] reported that she and her husband are doing fine.  Her husband is working, and balancing being here with her and being at home (an hour away) to take care of their home and their dogs.  She reports that she is coping fine with being here in the hospital.  She did not wish to talk further at this time, but is aware of our ongoing availability for support.  Chaplain Dyanne CarrelKaty Fountain Der[place]a, Bcc Pager, [phone] 1:30 PM    03/30/16 1300  Clinical Encounter Type  Visited With Patient  Visit Type Spiritual support  Referral From Nurse  Spiritual Encounters  Spiritual Needs Emotional

## 2016-03-31 DIAGNOSIS — O42919 Preterm premature rupture of membranes, unspecified as to length of time between rupture and onset of labor, unspecified trimester: Secondary | ICD-10-CM

## 2016-03-31 DIAGNOSIS — Z3A23 23 weeks gestation of pregnancy: Secondary | ICD-10-CM

## 2016-03-31 NOTE — Progress Notes (Signed)
Patient ID: Amanda [name], female   DOB: 04/13/1991, 25 y.o.   MRN: [id number] ACULTY PRACTICE ANTEPARTUM COMPREHENSIVE PROGRESS NOTE  Amanda SewerShawnee Henneman is a 25 y.o. G3P0020 at 7328w3d  who is admitted for PPROM @ 18 weeks.    Fetal presentation is breech.  Length of Stay:  3  Days  Subjective: Pt with on complaints Patient reports good fetal movement.  She reports no uterine contractions and no bleeding but, does reports continued leakage of clear fluid per vagina.  Vitals:  Blood pressure 149/90, pulse 57, temperature 98.3 F (36.8 C), temperature source Oral, resp. rate 18, height 5\' 1"  (1.549 m), weight 264 lb (119.75 kg), last menstrual period 10/12/2015. Physical Examination: General appearance - alert, well appearing, and in no distress Abdomen - soft, nontender, nondistended, no masses or organomegaly gravid Extremities - no pedal edema noted, feet normal, good pulses, normal color, temperature and sensation Cervical Exam: Not evaluated.  Membranes:ruptured  Fetal Monitoring:  Baseline: 140's bpm, Variability: Good {> 6 bpm), Accelerations: Non-reactive but appropriate for gestational age and Decelerations: Absent  Labs:  Results for orders placed or performed during the hospital encounter of 03/28/16 (from the past 24 hour(s))  OB RESULT CONSOLE Group B Strep   Collection Time: 03/30/16  2:00 PM  Result Value Ref Range   GBS Positive     Imaging Studies:    None pending at present   Medications:  Scheduled . amoxicillin  500 mg Oral Q8H  . aspirin  81 mg Oral Daily  . docusate sodium  100 mg Oral Daily  . erythromycin  500 mg Oral Q6H  . labetalol  200 mg Oral BID  . magnesium oxide  200 mg Oral Daily  . prenatal multivitamin  1 tablet Oral Q1200  . senna-docusate  1 tablet Oral BID  . sodium chloride flush  3 mL Intravenous Q12H  . vitamin C  1,000 mg Oral Daily   I have reviewed the patient's current medications.  ASSESSMENT: Patient Active Problem List   Diagnosis Date Noted  . Preterm premature rupture of membranes (PPROM) with unknown onset of labor 03/28/2016  . Elevated MSAFP (maternal serum alpha-fetoprotein) 03/15/2016  . Suspected cleft lip, antepartum 03/15/2016  . Supervision of high-risk pregnancy 03/11/2016  . Preterm premature rupture of membranes in second trimester 02/27/2016    PLAN: Continue latency antibiotics Deliver for s/sx of infection or other adverse conditions of mother or fetus or [redacted] weeks gestation Pt s/p course of BMZ Continue routine antenatal care. Patient desires c-section for delivery if continued breech at that time.  I have reviewed with the risk of pulmonary hypoplasia that cannot be dx'd until post delivery which could reuslt in the inability to ventilate the infant. Patient reports that she understands    Tereso NewcomerANYANWU,[name], MD 03/31/2016,8:52 AM

## 2016-04-01 DIAGNOSIS — Z3A23 23 weeks gestation of pregnancy: Secondary | ICD-10-CM

## 2016-04-01 DIAGNOSIS — O42112 Preterm premature rupture of membranes, onset of labor more than 24 hours following rupture, second trimester: Secondary | ICD-10-CM

## 2016-04-01 NOTE — Progress Notes (Signed)
Patient ID: Amanda [name], female   DOB: 10/11/1990, 25 y.o.   MRN: [id number] Patient ID: Amanda [name], female   DOB: 05/14/1991, 25 y.o.   MRN: [id number] ACULTY PRACTICE ANTEPARTUM COMPREHENSIVE PROGRESS NOTE  Amanda [name] is a 25 y.o. G3P0020 at 8436w4d   who is admitted for PPROM @ 18 weeks.    Fetal presentation is breech.  Length of Stay:  4  Days  Subjective: Pt with no complaints Patient reports good fetal movement.  She reports no uterine contractions and no bleeding but, does reports continued leakage of clear fluid per vagina.  Vitals:  Blood pressure 128/70, pulse 67, temperature 98.7 F (37.1 C), temperature source Oral, resp. rate 16, height 5\' 1"  (1.549 m), weight 266 lb 3.2 oz (120.748 kg), last menstrual period 10/12/2015. Physical Examination: General appearance - alert, well appearing, and in no distress Abdomen - soft, nontender, nondistended, no masses or organomegaly gravid Extremities - no pedal edema noted, feet normal, good pulses, normal color, temperature and sensation Cervical Exam: Not evaluated.  Membranes:ruptured  Fetal Monitoring:  Baseline: 140's bpm, Variability: Good {> 6 bpm), Accelerations: Non-reactive but appropriate for gestational age and Decelerations: Absent  Labs:  No results found for this or any previous visit (from the past 24 hour(s)).  Imaging Studies:    None pending at present   Medications:  Scheduled . amoxicillin  500 mg Oral Q8H  . aspirin  81 mg Oral Daily  . docusate sodium  100 mg Oral Daily  . erythromycin  500 mg Oral Q6H  . labetalol  200 mg Oral BID  . magnesium oxide  200 mg Oral Daily  . prenatal multivitamin  1 tablet Oral Q1200  . senna-docusate  1 tablet Oral BID  . sodium chloride flush  3 mL Intravenous Q12H  . vitamin C  1,000 mg Oral Daily   I have reviewed the patient's current medications.  ASSESSMENT: 4236w4d Estimated Date of Delivery: 07/25/16 PPROM since 02/25/2016  Patient Active Problem List   Diagnosis Date Noted  . Preterm premature rupture of membranes (PPROM) with unknown onset of labor 03/28/2016  . Elevated MSAFP (maternal serum alpha-fetoprotein) 03/15/2016  . Suspected cleft lip, antepartum 03/15/2016  . Supervision of high-risk pregnancy 03/11/2016  . Preterm premature rupture of membranes in second trimester 02/27/2016    PLAN: Continue latency antibiotics Deliver for s/sx of infection or other adverse conditions of mother or fetus or [redacted] weeks gestation Pt s/p course of BMZ Continue routine antenatal care. Patient desires c-section for delivery if continued breech at that time.  I have reviewed with the risk of pulmonary hypoplasia that cannot be dx'd until post delivery which could reuslt in the inability to ventilate the infant. Patient reports that she understands    Lazaro ArmsEURE,Amanda Platt H, MD 04/01/2016,7:13 AM

## 2016-04-02 LAB — TYPE AND SCREEN
ABO/RH(D): A POS
ANTIBODY SCREEN: NEGATIVE

## 2016-04-02 NOTE — Progress Notes (Signed)
FACULTY PRACTICE ANTEPARTUM(COMPREHENSIVE) NOTE  Amanda SewerShawnee [name] is a 25 y.o. G3P0020 at 5467w5d by early ultrasound who is admitted for rupture of membranes, PROM.   Fetal presentation is breech. Length of Stay:  5  Days  Subjective:  Patient reports the fetal movement as active. Patient reports uterine contraction  activity as none. Patient reports  vaginal bleeding as none. Patient describes fluid per vagina as Clear.  Vitals:  Blood pressure 135/85, pulse 65, temperature 98.7 F (37.1 C), temperature source Oral, resp. rate 18, height 5\' 1"  (1.549 m), weight 120.748 kg (266 lb 3.2 oz), last menstrual period 10/12/2015. Physical Examination:  General appearance - alert, well appearing, and in no distress Heart - normal rate and regular rhythm Abdomen - soft, nontender, nondistended Fundal Height:  size equals dates Cervical Exam: Not evaluated.  Extremities: extremities normal, atraumatic, no cyanosis or edema and Homans sign is negative, no sign of DVT  Membranes:ruptured  Fetal Monitoring:     Fetal Heart Rate A      Mode  External filed at 04/01/2016 2304    Baseline Rate (A)  145 bpm filed at 04/01/2016 2304    Variability  6-25 BPM filed at 04/01/2016 2304    Accelerations  10 x 10 filed at 04/01/2016 1720    Decelerations  Variable filed at 04/01/2016 2304      Labs:  No results found for this or any previous visit (from the past 24 hour(s)).  Imaging Studies:    Currently EPIC will not allow sonographic studies to automatically populate into notes.  In the meantime, copy and paste results into note or free text.  Medications:  Scheduled . amoxicillin  500 mg Oral Q8H  . aspirin  81 mg Oral Daily  . docusate sodium  100 mg Oral Daily  . erythromycin  500 mg Oral Q6H  . labetalol  200 mg Oral BID  . magnesium oxide  200 mg Oral Daily  . prenatal multivitamin  1 tablet Oral Q1200  . senna-docusate  1 tablet Oral BID  . sodium chloride flush  3 mL  Intravenous Q12H  . vitamin C  1,000 mg Oral Daily   I have reviewed the patient's current medications.  ASSESSMENT: Patient Active Problem List   Diagnosis Date Noted  . Preterm premature rupture of membranes (PPROM) with unknown onset of labor 03/28/2016  . Elevated MSAFP (maternal serum alpha-fetoprotein) 03/15/2016  . Suspected cleft lip, antepartum 03/15/2016  . Supervision of high-risk pregnancy 03/11/2016  . Preterm premature rupture of membranes in second trimester 02/27/2016    PLAN: Complete ABX course, follow for s/sx of infection, PTL, fetal compromise  ARNOLD,JAMES 04/02/2016,7:43 AM

## 2016-04-03 NOTE — Progress Notes (Signed)
Patient ID: Amanda [name], female   DOB: 11/15/1990, 25 y.o.   MRN: [id number] ACULTY PRACTICE ANTEPARTUM COMPREHENSIVE PROGRESS NOTE  Amanda SewerShawnee [name] is a 25 y.o. G3P0020 at 7059w6d  who is admitted for PROM.   Fetal presentation is breech. Length of Stay:  6  Days  Subjective: Pt with no complaints.    Patient reports good fetal movement.  She reports no uterine contractions, no bleeding and no loss of fluid per vagina.  Vitals:  Blood pressure 127/76, pulse 90, temperature 98.7 F (37.1 C), temperature source Oral, resp. rate 15, height 5\' 1"  (1.549 m), weight 266 lb 3.2 oz (120.748 kg), last menstrual period 10/12/2015. Physical Examination: General appearance - alert, well appearing, and in no distress Abdomen - soft, nontender, nondistended, no masses or organomegaly gravid Extremities - peripheral pulses normal, no pedal edema, no clubbing or cyanosis Cervical Exam: Not evaluated. Membranes:ruptured  Fetal Monitoring:  Baseline: 150's bpm, Variability: Good {> 6 bpm), Accelerations: Non-reactive but appropriate for gestational age, Decelerations: Variable: mild and TOCO: no ctx  Labs:  No results found for this or any previous visit (from the past 24 hour(s)).  Imaging Studies:    None pending   Medications:  Scheduled . amoxicillin  500 mg Oral Q8H  . aspirin  81 mg Oral Daily  . docusate sodium  100 mg Oral Daily  . erythromycin  500 mg Oral Q6H  . labetalol  200 mg Oral BID  . magnesium oxide  200 mg Oral Daily  . prenatal multivitamin  1 tablet Oral Q1200  . senna-docusate  1 tablet Oral BID  . sodium chloride flush  3 mL Intravenous Q12H  . vitamin C  1,000 mg Oral Daily   I have reviewed the patient's current medications.  ASSESSMENT: Patient Active Problem List   Diagnosis Date Noted  . Preterm premature rupture of membranes (PPROM) with unknown onset of labor 03/28/2016  . Elevated MSAFP (maternal serum alpha-fetoprotein) 03/15/2016  . Suspected cleft lip,  antepartum 03/15/2016  . Supervision of high-risk pregnancy 03/11/2016  . Preterm premature rupture of membranes in second trimester 02/27/2016    PLAN: Continue latency antibiotics Deliver for s/sx of infection or other adverse conditions of mother or fetus or [redacted] weeks gestation Pt s/p course of BMZ Continue routine antenatal care. Patient desires c-section for delivery if continued breech at that time. Continue routine antenatal care.   HARRAWAY-SMITH, Mirha Brucato 04/03/2016,6:23 AM

## 2016-04-04 DIAGNOSIS — O42112 Preterm premature rupture of membranes, onset of labor more than 24 hours following rupture, second trimester: Secondary | ICD-10-CM

## 2016-04-04 DIAGNOSIS — Z3A24 24 weeks gestation of pregnancy: Secondary | ICD-10-CM

## 2016-04-04 NOTE — Progress Notes (Signed)
Patient ID: Amanda [name], female   DOB: 09/30/1990, 25 y.o.   MRN: [id number] ACULTY PRACTICE ANTEPARTUM COMPREHENSIVE PROGRESS NOTE  Amanda [name] is a 25 y.o. G3P0020 at 5118w0d  who is admitted for PPROM.   Fetal presentation is breech. Length of Stay:  7  Days  Subjective: Pt with no complaints.    Patient reports good fetal movement.  She reports no uterine contractions, no bleeding and small amount of loss of clear fluid per vagina.  Vitals:  Blood pressure 114/44, pulse 91, temperature 98.8 F (37.1 C), temperature source Oral, resp. rate 20, height 5\' 1"  (1.549 m), weight 266 lb 3.2 oz (120.748 kg), last menstrual period 10/12/2015. Physical Examination: General appearance - alert, well appearing, and in no distress Abdomen - soft, gravid, nontender, nondistended, no masses or organomegaly Extremities - peripheral pulses normal, no pedal edema, no clubbing or cyanosis Cervical Exam: Not evaluated.  Fetal Monitoring:  Baseline: 150's bpm, Variability: Good {> 6 bpm), Accelerations: Non-reactive but appropriate for gestational age, Decelerations: Variable: mild and TOCO: no ctx  Labs:  No results found for this or any previous visit (from the past 24 hour(s)).  Imaging Studies:    None pending   Medications:  Scheduled . amoxicillin  500 mg Oral Q8H  . aspirin  81 mg Oral Daily  . docusate sodium  100 mg Oral Daily  . erythromycin  500 mg Oral Q6H  . labetalol  200 mg Oral BID  . magnesium oxide  200 mg Oral Daily  . prenatal multivitamin  1 tablet Oral Q1200  . senna-docusate  1 tablet Oral BID  . sodium chloride flush  3 mL Intravenous Q12H  . vitamin C  1,000 mg Oral Daily   I have reviewed the patient's current medications.  ASSESSMENT: Patient Active Problem List   Diagnosis Date Noted  . Preterm premature rupture of membranes (PPROM) with unknown onset of labor 03/28/2016  . Elevated MSAFP (maternal serum alpha-fetoprotein) 03/15/2016  . Suspected cleft lip,  antepartum 03/15/2016  . Supervision of high-risk pregnancy 03/11/2016  . Preterm premature rupture of membranes in second trimester 02/27/2016    PLAN: She is s/p latency antibiotics and BMZ Deliver for s/sx of infection or other adverse conditions of mother or fetus or [redacted] weeks gestation Patient desires c-section for delivery if continued breech at that time; repeat AFI/limited scan ordered for 04/05/16. Continue routine antenatal care.   Tereso NewcomerANYANWU,Mikhala Kenan A, MD 04/04/2016,8:43 AM

## 2016-04-05 ENCOUNTER — Inpatient Hospital Stay (HOSPITAL_COMMUNITY): Payer: Medicaid Other

## 2016-04-05 LAB — CBC
HEMATOCRIT: 39.3 % (ref 36.0–46.0)
HEMOGLOBIN: 13.2 g/dL (ref 12.0–15.0)
MCH: 30.5 pg (ref 26.0–34.0)
MCHC: 33.6 g/dL (ref 30.0–36.0)
MCV: 90.8 fL (ref 78.0–100.0)
Platelets: 227 10*3/uL (ref 150–400)
RBC: 4.33 MIL/uL (ref 3.87–5.11)
RDW: 14 % (ref 11.5–15.5)
WBC: 12.5 10*3/uL — ABNORMAL HIGH (ref 4.0–10.5)

## 2016-04-05 LAB — TYPE AND SCREEN
ABO/RH(D): A POS
Antibody Screen: NEGATIVE

## 2016-04-05 NOTE — Progress Notes (Signed)
Initial Nutrition Assessment  DOCUMENTATION CODES:  Morbid obesity  INTERVENTION:  Regular diet, snack and retail menu options  NUTRITION DIAGNOSIS:  Increased nutrient needs related to  (pregnancy and fetal growth requirements) as evidenced by  (24 weeks IUP).  GOAL:  Patient will meet greater than or equal to 90% of their needs  MONITOR:  Weight trends  REASON FOR ASSESSMENT:  Antenatal   ASSESSMENT:  24 1/7 weeks, PROM. Pt reports a 20 lb weight gain since conception. Reports good appetite, no nausea. Pre-preg BMI 46.6 - morbid obesity  Diet Order:  Diet regular Room service appropriate?: Yes; Fluid consistency:: Thin  Skin:  Reviewed, no issues  Height:   Ht Readings from Last 1 Encounters:  03/28/16 5\' 1"  (1.549 m)    Weight:   Wt Readings from Last 1 Encounters:  03/31/16 266 lb 3.2 oz (120.748 kg)    Ideal Body Weight:   105 lbs  BMI:  Body mass index is 50.32 kg/(m^2).  Estimated Nutritional Needs:   Kcal:  2200-2400  Protein:  98-108 g  Fluid:  2.5 L  EDUCATION NEEDS:   No education needs identified at this time  Inez PilgrimKatherine Waseem Suess M.Odis LusterEd. R.D. LDN Neonatal Nutrition Support Specialist/RD III Pager [phone]      Phone [phone]

## 2016-04-05 NOTE — Progress Notes (Signed)
FACULTY PRACTICE ANTEPARTUM(COMPREHENSIVE) NOTE  Amanda [name] is a 25 y.o. G3P0020 at 697w1d by  who is admitted for PROM.   Fetal presentation is breech. Length of Stay:  8  Days  Subjective:  Patient reports the fetal movement as active. Patient reports uterine contraction  activity as none. Patient reports  vaginal bleeding as none. Patient describes fluid per vagina as Clear.  Vitals:  Blood pressure 115/77, pulse 87, temperature 98.1 F (36.7 C), temperature source Oral, resp. rate 16, height 5\' 1"  (1.549 m), weight 120.748 kg (266 lb 3.2 oz), last menstrual period 10/12/2015. Physical Examination:  General appearance - alert, well appearing, and in no distress Heart - normal rate and regular rhythm Abdomen - soft, nontender, nondistended Fundal Height:  size equals dates not tender Cervical Exam: Not evaluated.  Extremities: extremities normal, atraumatic, no cyanosis or edema and Homans sign is negative, no sign of DVT   Fetal Monitoring:  Fetal Heart Rate A      Mode  -- [off] filed at 04/04/2016 2300    Baseline Rate (A)  145 bpm filed at 04/04/2016 2137    Variability  6-25 BPM filed at 04/04/2016 2137    Accelerations  10 x 10 filed at 04/04/2016 2137    Decelerations  Variable filed at 04/04/2016 2137      Labs:  Results for orders placed or performed during the hospital encounter of 03/28/16 (from the past 24 hour(s))  CBC   Collection Time: 04/05/16  5:24 AM  Result Value Ref Range   WBC 12.5 (H) 4.0 - 10.5 K/uL   RBC 4.33 3.87 - 5.11 MIL/uL   Hemoglobin 13.2 12.0 - 15.0 g/dL   HCT 78.239.3 95.636.0 - 21.346.0 %   MCV 90.8 78.0 - 100.0 fL   MCH 30.5 26.0 - 34.0 pg   MCHC 33.6 30.0 - 36.0 g/dL   RDW 08.614.0 57.811.5 - 46.915.5 %   Platelets 227 150 - 400 K/uL     Medications:  Scheduled . aspirin  81 mg Oral Daily  . docusate sodium  100 mg Oral Daily  . labetalol  200 mg Oral BID  . magnesium oxide  200 mg Oral Daily  . prenatal multivitamin  1 tablet Oral  Q1200  . senna-docusate  1 tablet Oral BID  . sodium chloride flush  3 mL Intravenous Q12H  . vitamin C  1,000 mg Oral Daily   I have reviewed the patient's current medications.  ASSESSMENT: Patient Active Problem List   Diagnosis Date Noted  . Preterm premature rupture of membranes (PPROM) with unknown onset of labor 03/28/2016  . Elevated MSAFP (maternal serum alpha-fetoprotein) 03/15/2016  . Suspected cleft lip, antepartum 03/15/2016  . Supervision of high-risk pregnancy 03/11/2016  . Preterm premature rupture of membranes in second trimester 02/27/2016    PLAN: Continue observation for PPROM, US in MFM today  Ladarion Munyon 04/05/2016,7:04 AM

## 2016-04-06 NOTE — Progress Notes (Signed)
Daily Antepartum Note  Admission Date: 03/28/2016 Current Date: 04/06/2016 7:20 AM  Amanda [name] is a 25 y.o. G3P0020 @ [redacted]w[redacted]d (EDC 11/5, dated by 6wk u/s), HD#10, admitted for PPROM @ 18wks.  Pregnancy complicated by: BMI 50, cHTN, abnormal genetic screening/markers, GBS pos   Overnight/24hr events:  None  Subjective:  No fevers, chills, s/s of preterm labor or decreased FM. +clear LOF  Objective:    Current Vital Signs 24h Vital Sign Ranges  T 98.1 F (36.7 C) Temp  Avg: 98.4 F (36.9 C)  Min: 97.8 F (36.6 C)  Max: 99.4 F (37.4 C)  BP 109/74 mmHg BP  Min: 109/74  Max: 145/92  HR 73 Pulse  Avg: 83.8  Min: 73  Max: 92  RR 16 Resp  Avg: 16.1  Min: 16  Max: 17  SaO2     No Data Recorded       24 Hour I/O Current Shift I/O  Time Ins Outs       Patient Vitals for the past 24 hrs:  BP Temp Temp src Pulse Resp  04/05/16 2300 - - - - 16  04/05/16 2220 - - - - 16  04/05/16 2128 - - - - 16  04/05/16 2056 - - - - 16  04/05/16 1920 109/74 mmHg 98.1 F (36.7 C) Oral 73 16  04/05/16 1800 120/87 mmHg - - 87 -  04/05/16 1556 (!) 145/92 mmHg - - 92 -  04/05/16 1555 - 97.8 F (36.6 C) Oral - 16  04/05/16 1158 115/64 mmHg 98.1 F (36.7 C) Oral 85 16  04/05/16 0945 126/71 mmHg - - 84 17  04/05/16 0851 125/73 mmHg 99.4 F (37.4 C) Oral 82 16    Physical exam: General: Well nourished, well developed female in no acute distress. Abdomen: gravid, obese, nttp Respiratory: no resp distress Extremities: no clubbing, cyanosis or edema Skin: Warm and dry.   Medications: Current Facility-Administered Medications  Medication Dose Route Frequency Provider Last Rate Last Dose  . acetaminophen (TYLENOL) tablet 650 mg  650 mg Oral Q4H PRN Lazaro Arms, MD      . aspirin chewable tablet 81 mg  81 mg Oral Daily Lazaro Arms, MD   81 mg at 04/05/16 0944  . calcium carbonate (TUMS - dosed in mg elemental calcium) chewable tablet 400 mg of elemental calcium  2 tablet Oral Q4H PRN Lazaro Arms, MD      . docusate sodium (COLACE) capsule 100 mg  100 mg Oral Daily Lazaro Arms, MD   100 mg at 04/05/16 0944  . labetalol (NORMODYNE) tablet 200 mg  200 mg Oral BID Lazaro Arms, MD   200 mg at 04/05/16 2129  . magnesium oxide (MAG-OX) tablet 200 mg  200 mg Oral Daily Lazaro Arms, MD   200 mg at 04/05/16 0944  . prenatal multivitamin tablet 1 tablet  1 tablet Oral Q1200 Lazaro Arms, MD   1 tablet at 04/05/16 1837  . senna-docusate (Senokot-S) tablet 1 tablet  1 tablet Oral BID Lazaro Arms, MD   1 tablet at 04/05/16 2129  . sodium chloride flush (NS) 0.9 % injection 3 mL  3 mL Intravenous Q12H Lazaro Arms, MD   3 mL at 04/05/16 2129  . vitamin C (ASCORBIC ACID) tablet 1,000 mg  1,000 mg Oral Daily Lazaro Arms, MD   1,000 mg at 04/05/16 0944  . zolpidem (AMBIEN) tablet 5 mg  5 mg Oral QHS  PRN Lazaro ArmsLuther H Eure, MD         Recent Labs Lab 04/05/16 0524  WBC 12.5*  HGB 13.2  HCT 39.3  PLT 227   Radiology: 7/17: stable u/s with no fluid seen, breech (footling), normal CL (4cm) 6/29: 468g, 53%, nl AC, no fluid Assessment & Plan:  Patient stable *IUP: fetal status reassuring. Continue with surveillance NSTs, prenatal vitamin *PPROM: exp management until 34wks or other need for delivery. Rpt growth in 2wks. Rescue course BMZ in 1wk -pt declined further genetic screening after meeting with counseling -s/p MFM and NICU consults -BMZ on 7/9&10 and s/p latency antibiotics *cHTN: stable on no meds *BMI 50: see above *GBS pos: treat for any s/s of labor.  *FEN/GI: regular diet, saline lock IV *PPx: OOB ad lib, SCDs *Dispo: 34wks or need for earlier delivery  Cornelia Copaharlie Vylet Maffia, Jr. MD Attending Center [facility](Faculty Practice)

## 2016-04-07 NOTE — Clinical Documentation Improvement (Signed)
OB/GYN  Possible Conditions?       Obesity  Type - morbid obesity, obesity, overweight, with alveolar hypoventilation (Pickwickian Syndrome)  Other  Clinically Undetermined  Document any associated diagnoses/condition associated with BMI of 50,  Please update your documentation within the medical record to reflect your response to this query. Thank you.  Supporting Information: BMI 50  Please exercise your independent, professional judgment when responding. A specific answer is not anticipated or expected.  Thank You, Nevin BloodgoodJoan B Karlissa Aron, RN, BSN, CCDS,Clinical Documentation Specialist:  [phone]  [phone]=[employer]- Health Information Management

## 2016-04-07 NOTE — Progress Notes (Signed)
Patient ID: Amanda [name], female   DOB: 07/20/1991, 25 y.o.   MRN: [id number] ACULTY PRACTICE ANTEPARTUM COMPREHENSIVE PROGRESS NOTE  Amanda [name] is a 25 y.o. G3P0020 at 3568w3d  who is admitted for PROM.   Fetal presentation is breech. Length of Stay:  10  Days  Subjective: Pt with no complaints Patient reports good fetal movement.  She reports no uterine contractions and no bleeding.  She does report continued loss of fluid per vagina.  Vitals:  Blood pressure 137/87, pulse 94, temperature 98.8 F (37.1 C), temperature source Oral, resp. rate 16, height 5\' 1"  (1.549 m), weight 266 lb 3.2 oz (120.748 kg), last menstrual period 10/12/2015. Physical Examination: General appearance - alert, well appearing, and in no distress Abdomen - soft, nontender, nondistended, no masses or organomegaly Extremities - peripheral pulses normal, no pedal edema, no clubbing or cyanosis Cervical Exam: Not evaluated.  Membranes:ruptured  Fetal Monitoring:  Baseline: 140's bpm, Variability: Good {> 6 bpm), Accelerations: Non-reactive but appropriate for gestational age, Decelerations: Absent and Toco without contractions  Labs:  CBC Latest Ref Rng 04/05/2016 03/25/2016 03/11/2016  WBC 4.0 - 10.5 K/uL 12.5(H) 9.0 10.5  Hemoglobin 12.0 - 15.0 g/dL 78.213.2 95.613.1 21.314.1  Hematocrit 36.0 - 46.0 % 39.3 37.4 40.6  Platelets 150 - 400 K/uL 227 232 259     Imaging Studies:    Sono: 04/05/2016 severe oligo; footling breech; cervical length 4.75cm   Medications:  Scheduled . aspirin  81 mg Oral Daily  . docusate sodium  100 mg Oral Daily  . labetalol  200 mg Oral BID  . magnesium oxide  200 mg Oral Daily  . prenatal multivitamin  1 tablet Oral Q1200  . senna-docusate  1 tablet Oral BID  . sodium chloride flush  3 mL Intravenous Q12H  . vitamin C  1,000 mg Oral Daily   I have reviewed the patient's current medications.  ASSESSMENT: Patient Active Problem List   Diagnosis Date Noted  . Preterm premature rupture of  membranes (PPROM) with unknown onset of labor 03/28/2016  . Elevated MSAFP (maternal serum alpha-fetoprotein) 03/15/2016  . Suspected cleft lip, antepartum 03/15/2016  . Supervision of high-risk pregnancy 03/11/2016  . Preterm premature rupture of membranes in second trimester 02/27/2016    PLAN: She is s/p latency antibiotics and BMZ Deliver for s/sx of infection or other adverse conditions of mother or fetus or [redacted] weeks gestation Patient desires c-section for delivery if continued breech at that time Continue routine antenatal care   HARRAWAY-SMITH, Quinnlan Abruzzo 04/07/2016,6:36 AM

## 2016-04-08 DIAGNOSIS — O42112 Preterm premature rupture of membranes, onset of labor more than 24 hours following rupture, second trimester: Secondary | ICD-10-CM | POA: Diagnosis not present

## 2016-04-08 LAB — CBC
HCT: 38.1 % (ref 36.0–46.0)
Hemoglobin: 13 g/dL (ref 12.0–15.0)
MCH: 30.9 pg (ref 26.0–34.0)
MCHC: 34.1 g/dL (ref 30.0–36.0)
MCV: 90.5 fL (ref 78.0–100.0)
Platelets: 228 K/uL (ref 150–400)
RBC: 4.21 MIL/uL (ref 3.87–5.11)
RDW: 14.2 % (ref 11.5–15.5)
WBC: 11.7 K/uL — ABNORMAL HIGH (ref 4.0–10.5)

## 2016-04-08 LAB — TYPE AND SCREEN
ABO/RH(D): A POS
Antibody Screen: NEGATIVE

## 2016-04-08 MED ORDER — LACTATED RINGERS IV BOLUS (SEPSIS)
500.0000 mL | Freq: Once | INTRAVENOUS | Status: AC
  Administered 2016-04-08: 500 mL via INTRAVENOUS

## 2016-04-08 NOTE — Progress Notes (Signed)
Patient ID: Amanda [name], female   DOB: 08/17/1991, 25 y.o.   MRN: [id number] ACULTY PRACTICE ANTEPARTUM COMPREHENSIVE PROGRESS NOTE  Amanda [name] is a 25 y.o. G3P0020 at 2019w3d  who is admitted for PROM.   Fetal presentation is breech. Length of Stay:  11  Days  Subjective: Pt with no complaints Patient reports good fetal movement.  She reports no uterine contractions and no bleeding.  She does report continued loss of fluid per vagina.  Vitals:  Blood pressure 125/89, pulse 82, temperature 98 F (36.7 C), temperature source Oral, resp. rate 18, height 5\' 1"  (1.549 m), weight 264 lb 8 oz (119.976 kg), last menstrual period 10/12/2015. Physical Examination: General appearance - alert, well appearing, and in no distress Abdomen - soft, nontender, nondistended, no masses or organomegaly Extremities - peripheral pulses normal, no pedal edema, no clubbing or cyanosis Cervical Exam: Not evaluated.  Membranes:ruptured  Fetal Monitoring:  Baseline: 140's bpm, Variability: Good {> 6 bpm), Accelerations: Non-reactive but appropriate for gestational age, Decelerations: Absent and Toco without contractions  Labs:  CBC Latest Ref Rng 04/08/2016 04/05/2016 03/25/2016  WBC 4.0 - 10.5 K/uL 11.7(H) 12.5(H) 9.0  Hemoglobin 12.0 - 15.0 g/dL 13.013.0 86.513.2 78.413.1  Hematocrit 36.0 - 46.0 % 38.1 39.3 37.4  Platelets 150 - 400 K/uL 228 227 232     Imaging Studies:    Sono: 04/05/2016 severe oligo; footling breech; cervical length 4.75cm   Medications:  Scheduled . aspirin  81 mg Oral Daily  . docusate sodium  100 mg Oral Daily  . labetalol  200 mg Oral BID  . magnesium oxide  200 mg Oral Daily  . prenatal multivitamin  1 tablet Oral Q1200  . senna-docusate  1 tablet Oral BID  . sodium chloride flush  3 mL Intravenous Q12H  . vitamin C  1,000 mg Oral Daily   I have reviewed the patient's current medications.  ASSESSMENT: Patient Active Problem List   Diagnosis Date Noted  . Preterm premature rupture of  membranes (PPROM) with unknown onset of labor 03/28/2016  . Elevated MSAFP (maternal serum alpha-fetoprotein) 03/15/2016  . Suspected cleft lip, antepartum 03/15/2016  . Supervision of high-risk pregnancy 03/11/2016  . Preterm premature rupture of membranes in second trimester 02/27/2016  . Morbid (severe) obesity due to excess calories ([facility]CC) 01/01/2016    PLAN: 1.  PPROM  She is s/p latency antibiotics and BMZ  Deliver for s/sx of infection or other adverse conditions of mother or fetus or [redacted] weeks gestation  Patient desires c-section for delivery if continued breech at that time 2.  24 week Pregnancy Continue routine antenatal care   STINSON, JACOB JEHIEL 04/08/2016,1:08 PM

## 2016-04-09 DIAGNOSIS — O42112 Preterm premature rupture of membranes, onset of labor more than 24 hours following rupture, second trimester: Secondary | ICD-10-CM | POA: Diagnosis not present

## 2016-04-09 NOTE — Progress Notes (Signed)
Patient ID: Amanda [name], female   DOB: 08/28/1991, 25 y.o.   MRN: [id number] ACULTY PRACTICE ANTEPARTUM COMPREHENSIVE PROGRESS NOTE  Amanda [name] is a 25 y.o. G3P0020 at 6480w3d  who is admitted for PROM.   Fetal presentation is breech. Length of Stay:  12  Days  Subjective: Had abdominal pain last night and decel on monitor.  Improved with fluids, repositioning.  Has mild uterine tenderness.  No fever or contractions. Patient reports good fetal movement.  She reports no uterine contractions and no bleeding.  She does report continued loss of fluid per vagina.  Vitals:  Blood pressure 113/66, pulse 108, temperature 98.3 F (36.8 C), temperature source Oral, resp. rate 18, height 5\' 1"  (1.549 m), weight 264 lb 8 oz (119.976 kg), last menstrual period 10/12/2015. Physical Examination: General appearance - alert, well appearing, and in no distress Heart: regular rate, no murmur Lungs: clear to auscultation bilaterally, no wheezing.  Abdomen - soft, mild tenderness to uterus, nondistended, no masses or organomegaly Extremities - peripheral pulses normal, no pedal edema, no clubbing or cyanosis Cervical Exam: Not evaluated.  Membranes:ruptured  Fetal Monitoring:  Baseline: 140's bpm, Variability: Good {> 6 bpm), Accelerations: Non-reactive but appropriate for gestational age, Decelerations: Absent and Toco without contractions  Labs:  CBC Latest Ref Rng 04/08/2016 04/05/2016 03/25/2016  WBC 4.0 - 10.5 K/uL 11.7(H) 12.5(H) 9.0  Hemoglobin 12.0 - 15.0 g/dL 21.313.0 08.613.2 57.813.1  Hematocrit 36.0 - 46.0 % 38.1 39.3 37.4  Platelets 150 - 400 K/uL 228 227 232     Imaging Studies:    Sono: 04/05/2016 severe oligo; footling breech; cervical length 4.75cm   Medications:  Scheduled . aspirin  81 mg Oral Daily  . docusate sodium  100 mg Oral Daily  . labetalol  200 mg Oral BID  . magnesium oxide  200 mg Oral Daily  . prenatal multivitamin  1 tablet Oral Q1200  . senna-docusate  1 tablet Oral BID  .  sodium chloride flush  3 mL Intravenous Q12H  . vitamin C  1,000 mg Oral Daily   I have reviewed the patient's current medications.  ASSESSMENT: Patient Active Problem List   Diagnosis Date Noted  . Preterm premature rupture of membranes (PPROM) with unknown onset of labor 03/28/2016  . Elevated MSAFP (maternal serum alpha-fetoprotein) 03/15/2016  . Suspected cleft lip, antepartum 03/15/2016  . Supervision of high-risk pregnancy 03/11/2016  . Preterm premature rupture of membranes in second trimester 02/27/2016  . Morbid (severe) obesity due to excess calories (HCC) 01/01/2016    PLAN: 1.  PPROM  She is s/p latency antibiotics and BMZ  Watch closely for si/sxs of infection  Deliver for s/sx of infection or other adverse conditions of mother or fetus or [redacted] weeks gestation  Patient desires c-section for delivery if continued breech at that time 2.  24 week Pregnancy Continue routine antenatal care   STINSON, JACOB JEHIEL 04/09/2016,7:31 AM

## 2016-04-10 DIAGNOSIS — O42112 Preterm premature rupture of membranes, onset of labor more than 24 hours following rupture, second trimester: Secondary | ICD-10-CM | POA: Diagnosis not present

## 2016-04-10 NOTE — Progress Notes (Addendum)
Patient ID: Amanda [name], female   DOB: 09-Oct-1990, 25 y.o.   MRN: [id number] ACULT[facility] PRACTICE ANTEPARTUM COMPREHENSIVE PROGRESS NOTE  Amanda [name] is a 25 y.o. G3P0020 at [redacted]w[redacted]d  who is admitted for PROM.   Fetal presentation is breech. Length of Stay:  13  Days  Subjective: Abdominal pain improved.  Has no other complaints.  No fever or contractions. Patient reports good fetal movement.  She reports no uterine contractions and no bleeding.  She does report continued loss of fluid per vagina.  Vitals:  Blood pressure 132/87, pulse 79, temperature 97.9 F (36.6 C), temperature source Oral, resp. rate 18, height 5\' 1"  (1.549 m), weight 264 lb 8 oz (119.976 kg), last menstrual period 10/12/2015. Physical Examination: General appearance - alert, well appearing, and in no distress Heart: regular rate, no murmur Lungs: clear to auscultation bilaterally, no wheezing.  Abdomen - soft, mild tenderness to uterus, nondistended, no masses or organomegaly Extremities - peripheral pulses normal, no pedal edema, no clubbing or cyanosis Cervical Exam: Not evaluated.  Membranes:ruptured  Fetal Monitoring:  Baseline: 140's bpm, Variability: Good {> 6 bpm), Accelerations: Non-reactive but appropriate for gestational age, Decelerations: Variable: mild and Toco without contractions  Labs:  CBC Latest Ref Rng 04/08/2016 04/05/2016 03/25/2016  WBC 4.0 - 10.5 K/uL 11.7(H) 12.5(H) 9.0  Hemoglobin 12.0 - 15.0 g/dL 05.6 97.9 48.0  Hematocrit 36.0 - 46.0 % 38.1 39.3 37.4  Platelets 150 - 400 K/uL 228 227 232     Imaging Studies:    Sono: 04/05/2016 severe oligo; footling breech; cervical length 4.75cm   Medications:  Scheduled . aspirin  81 mg Oral Daily  . docusate sodium  100 mg Oral Daily  . labetalol  200 mg Oral BID  . magnesium oxide  200 mg Oral Daily  . prenatal multivitamin  1 tablet Oral Q1200  . senna-docusate  1 tablet Oral BID  . sodium chloride flush  3 mL Intravenous Q12H  . vitamin C  1,000  mg Oral Daily   I have reviewed the patient's current medications.  ASSESSMENT: Patient Active Problem List   Diagnosis Date Noted  . Preterm premature rupture of membranes (PPROM) with unknown onset of labor 03/28/2016  . Elevated MSAFP (maternal serum alpha-fetoprotein) 03/15/2016  . Suspected cleft lip, antepartum 03/15/2016  . Supervision of high-risk pregnancy 03/11/2016  . Preterm premature rupture of membranes in second trimester 02/27/2016  . Morbid (severe) obesity due to excess calories (HCC) 01/01/2016    PLAN: 1.  PPROM  She is s/p latency antibiotics and BMZ  Watch closely for si/sxs of infection  Deliver for s/sx of infection or other adverse conditions of mother or fetus or [redacted] weeks gestation  Patient desires c-section for delivery if continued breech at that time  NST reviewed.  Appropriate for GA - 1 variable last night. 2.  24 week Pregnancy Continue routine antenatal care   STINSON, JACOB JEHIEL 04/10/2016,8:59 AM

## 2016-04-11 LAB — CBC
HCT: 38 % (ref 36.0–46.0)
Hemoglobin: 12.9 g/dL (ref 12.0–15.0)
MCH: 30.9 pg (ref 26.0–34.0)
MCHC: 33.9 g/dL (ref 30.0–36.0)
MCV: 91.1 fL (ref 78.0–100.0)
Platelets: 235 10*3/uL (ref 150–400)
RBC: 4.17 MIL/uL (ref 3.87–5.11)
RDW: 14.2 % (ref 11.5–15.5)
WBC: 10.5 10*3/uL (ref 4.0–10.5)

## 2016-04-11 LAB — TYPE AND SCREEN
ABO/RH(D): A POS
ANTIBODY SCREEN: NEGATIVE

## 2016-04-11 NOTE — Progress Notes (Signed)
Patient ID: Amanda [name], female   DOB: March 07, 1991, 25 y.o.   MRN: [id number] [facility]ULT[facility] PRACTICE ANTEPARTUM COMPREHENSIVE PROGRESS NOTE  Amanda [name] is a 25 y.o. G3P0020 at [redacted]w[redacted]d  who is admitted for PROM.   Fetal presentation is breech. Length of Stay:  14  Days  Subjective: Has no complaints.  No fever, abdominal pain, or contractions. Patient reports good fetal movement.  She reports no uterine contractions and no bleeding.  She does report continued loss of fluid per vagina.  Vitals:  Blood pressure 118/75, pulse 87, temperature 98.4 F (36.9 C), temperature source Oral, resp. rate 16, height 5\' 1"  (1.549 m), weight 264 lb 8 oz (120 kg), last menstrual period 10/12/2015. Physical Examination: General appearance - alert, well appearing, and in no distress Heart: regular rate, no murmur Lungs: clear to auscultation bilaterally, no wheezing.  Abdomen - soft, mild tenderness to uterus, nondistended, no masses or organomegaly Extremities - peripheral pulses normal, no pedal edema, no clubbing or cyanosis Cervical Exam: Not evaluated.  Membranes:ruptured  Fetal Monitoring:    Labs:  CBC Latest Ref Rng & Units 04/11/2016 04/08/2016 04/05/2016  WBC 4.0 - 10.5 K/uL 10.5 11.7(H) 12.5(H)  Hemoglobin 12.0 - 15.0 g/dL 83.2 54.9 82.6  Hematocrit 36.0 - 46.0 % 38.0 38.1 39.3  Platelets 150 - 400 K/uL 235 228 227     Imaging Studies:    Sono: 04/05/2016 severe oligo; footling breech; cervical length 4.75cm   Medications:  Scheduled . aspirin  81 mg Oral Daily  . docusate sodium  100 mg Oral Daily  . labetalol  200 mg Oral BID  . magnesium oxide  200 mg Oral Daily  . prenatal multivitamin  1 tablet Oral Q1200  . senna-docusate  1 tablet Oral BID  . sodium chloride flush  3 mL Intravenous Q12H  . vitamin C  1,000 mg Oral Daily   I have reviewed the patient's current medications.  ASSESSMENT: Patient Active Problem List   Diagnosis Date Noted  . Preterm premature rupture of membranes  (PPROM) with unknown onset of labor 03/28/2016  . Elevated MSAFP (maternal serum alpha-fetoprotein) 03/15/2016  . Suspected cleft lip, antepartum 03/15/2016  . Supervision of high-risk pregnancy 03/11/2016  . Preterm premature rupture of membranes in second trimester 02/27/2016  . Morbid (severe) obesity due to excess calories (HCC) 01/01/2016    PLAN: 1.  PPROM  She is s/p latency antibiotics and BMZ  Watch closely for si/sxs of infection  Deliver for s/sx of infection or other adverse conditions of mother or fetus or [redacted] weeks gestation  Patient desires c-section for delivery if continued breech at that time  [place] for growth next week.  Not able to access [employer] to review NST.   2.  24 week Pregnancy Continue routine antenatal care  Levie Heritage, DO  04/11/2016,5:59 AM

## 2016-04-12 DIAGNOSIS — O99210 Obesity complicating pregnancy, unspecified trimester: Secondary | ICD-10-CM | POA: Diagnosis present

## 2016-04-12 DIAGNOSIS — O42112 Preterm premature rupture of membranes, onset of labor more than 24 hours following rupture, second trimester: Secondary | ICD-10-CM | POA: Diagnosis not present

## 2016-04-12 DIAGNOSIS — Z3A25 25 weeks gestation of pregnancy: Secondary | ICD-10-CM

## 2016-04-12 MED ORDER — BETAMETHASONE SOD PHOS & ACET 6 (3-3) MG/ML IJ SUSP
12.0000 mg | Freq: Once | INTRAMUSCULAR | Status: AC
  Administered 2016-04-13: 12 mg via INTRAMUSCULAR
  Filled 2016-04-12: qty 2

## 2016-04-12 MED ORDER — BETAMETHASONE SOD PHOS & ACET 6 (3-3) MG/ML IJ SUSP
12.0000 mg | Freq: Once | INTRAMUSCULAR | Status: AC
  Administered 2016-04-12: 12 mg via INTRAMUSCULAR
  Filled 2016-04-12: qty 2

## 2016-04-12 NOTE — Progress Notes (Signed)
Patient ID: Amanda [name], female   DOB: 12/28/1990, 25 y.o.   MRN: [id number] FACULTY PRACTICE ANTEPARTUM(COMPREHENSIVE) NOTE  [name] is a 26 y.o. G3P0020 at [redacted]w[redacted]d by best clinical estimate who is admitted for PROM.   Fetal presentation is breech. Length of Stay:  15  Days  Subjective: Feels well. No issues today Patient reports the fetal movement as active. Patient reports uterine contraction  activity as none. Patient reports  vaginal bleeding as none. Patient describes fluid per vagina as Clear.  Vitals:  Blood pressure 122/77, pulse 81, temperature 98.9 F (37.2 C), temperature source Oral, resp. rate 16, height 5\' 1"  (1.549 m), weight 264 lb 8 oz (120 kg), last menstrual period 10/12/2015. Physical Examination:  General appearance - alert, well appearing, and in no distress Chest - normal effort Abdomen - gravid, NT Fundal Height:  size equals dates Extremities: Homans sign is negative, no sign of DVT  Membranes:ruptured, clear fluid  Fetal Monitoring:  Baseline: 145 bpm, Variability: Fair (1-6 bpm), Accelerations: Non-reactive but appropriate for gestational age and Decelerations: Variable: moderate   Medications:  Scheduled . aspirin  81 mg Oral Daily  . docusate sodium  100 mg Oral Daily  . labetalol  200 mg Oral BID  . magnesium oxide  200 mg Oral Daily  . prenatal multivitamin  1 tablet Oral Q1200  . senna-docusate  1 tablet Oral BID  . sodium chloride flush  3 mL Intravenous Q12H  . vitamin C  1,000 mg Oral Daily   I have reviewed the patient's current medications.  ASSESSMENT: Principal Problem:   Preterm premature rupture of membranes (PPROM) with unknown onset of labor Active Problems:   Supervision of high-risk pregnancy   Elevated MSAFP (maternal serum alpha-fetoprotein)   Suspected cleft lip, antepartum   Morbid (severe) obesity due to excess calories (HCC)   Obesity affecting pregnancy, antepartum  PLAN: Delivery with s/sx's of chorio For  C-section due to breech Continue inpatient monitoring Repeat BMZ today and tomorrow S/p NICU consult  Reva Bores, MD 04/12/2016,10:05 AM

## 2016-04-13 DIAGNOSIS — O42112 Preterm premature rupture of membranes, onset of labor more than 24 hours following rupture, second trimester: Secondary | ICD-10-CM | POA: Diagnosis not present

## 2016-04-13 LAB — CBC
HEMATOCRIT: 37 % (ref 36.0–46.0)
Hemoglobin: 12.7 g/dL (ref 12.0–15.0)
MCH: 31.1 pg (ref 26.0–34.0)
MCHC: 34.3 g/dL (ref 30.0–36.0)
MCV: 90.7 fL (ref 78.0–100.0)
Platelets: 203 10*3/uL (ref 150–400)
RBC: 4.08 MIL/uL (ref 3.87–5.11)
RDW: 14.3 % (ref 11.5–15.5)
WBC: 9 10*3/uL (ref 4.0–10.5)

## 2016-04-13 NOTE — Progress Notes (Signed)
Patient ID: Amanda [name], female   DOB: [birth year], 25 y.o.   MRN: [id number] FACULTY PRACTICE ANTEPARTUM(COMPREHENSIVE) NOTE  Amanda [name] is a 25 y.o. G3P0020 at [redacted]w[redacted]d by best clinical estimate who is admitted for PROM.   Fetal presentation is breech. Length of Stay:  16  Days  Subjective: Feels well. No issues today. Patient reports the fetal movement as active. Patient reports uterine contraction  activity as none. Patient reports  vaginal bleeding as none. Patient describes fluid per vagina as None.  Vitals:  Blood pressure 118/71, pulse 97, temperature 97.6 F (36.4 C), temperature source Oral, resp. rate 18, height 5\' 1"  (1.549 m), weight 264 lb 8 oz (120 kg), last menstrual period 10/12/2015. Physical Examination:  General appearance - alert, well appearing, and in no distress Chest - normal effort Abdomen - gravid, NT Fundal Height:  size equals dates Extremities: extremities normal, atraumatic, no cyanosis or edema  Membranes:ruptured, clear fluid  Fetal Monitoring:  Baseline: 150 bpm, Variability: Good {> 6 bpm), Accelerations: Reactive and Decelerations: Absent   Medications:  Scheduled . aspirin  81 mg Oral Daily  . betamethasone acetate-betamethasone sodium phosphate  12 mg Intramuscular Once  . docusate sodium  100 mg Oral Daily  . labetalol  200 mg Oral BID  . magnesium oxide  200 mg Oral Daily  . prenatal multivitamin  1 tablet Oral Q1200  . senna-docusate  1 tablet Oral BID  . sodium chloride flush  3 mL Intravenous Q12H  . vitamin C  1,000 mg Oral Daily   I have reviewed the patient's current medications.  ASSESSMENT: Principal Problem:   Preterm premature rupture of membranes (PPROM) with unknown onset of labor Active Problems:   Supervision of high-risk pregnancy   Elevated MSAFP (maternal serum alpha-fetoprotein)   Suspected cleft lip, antepartum   Morbid (severe) obesity due to excess calories (HCC)   Obesity affecting pregnancy,  antepartum   PLAN: Continue inpatient monitoring Delivery with s/sx's of chorio S/p BMZ x 2--2nd dose of 2nd course today  Amanda [name] S, MD 04/13/2016,7:14 AM

## 2016-04-14 ENCOUNTER — Inpatient Hospital Stay (HOSPITAL_COMMUNITY): Payer: Medicaid Other | Admitting: Anesthesiology

## 2016-04-14 ENCOUNTER — Encounter (HOSPITAL_COMMUNITY): Payer: Self-pay

## 2016-04-14 LAB — CBC
HCT: 37.8 % (ref 36.0–46.0)
Hemoglobin: 12.9 g/dL (ref 12.0–15.0)
MCH: 31 pg (ref 26.0–34.0)
MCHC: 34.1 g/dL (ref 30.0–36.0)
MCV: 90.9 fL (ref 78.0–100.0)
PLATELETS: 199 10*3/uL (ref 150–400)
RBC: 4.16 MIL/uL (ref 3.87–5.11)
RDW: 14.3 % (ref 11.5–15.5)
WBC: 7.6 10*3/uL (ref 4.0–10.5)

## 2016-04-14 LAB — PREPARE RBC (CROSSMATCH)

## 2016-04-14 LAB — CBC WITH DIFFERENTIAL/PLATELET
Basophils Absolute: 0 10*3/uL (ref 0.0–0.1)
Basophils Relative: 0 %
EOS ABS: 0.1 10*3/uL (ref 0.0–0.7)
EOS PCT: 1 %
HCT: 37.4 % (ref 36.0–46.0)
HEMOGLOBIN: 12.7 g/dL (ref 12.0–15.0)
LYMPHS ABS: 1 10*3/uL (ref 0.7–4.0)
Lymphocytes Relative: 17 %
MCH: 31.2 pg (ref 26.0–34.0)
MCHC: 34 g/dL (ref 30.0–36.0)
MCV: 91.9 fL (ref 78.0–100.0)
MONOS PCT: 10 %
Monocytes Absolute: 0.7 10*3/uL (ref 0.1–1.0)
NEUTROS PCT: 72 %
Neutro Abs: 4.5 10*3/uL (ref 1.7–7.7)
Platelets: 153 10*3/uL (ref 150–400)
RBC: 4.07 MIL/uL (ref 3.87–5.11)
RDW: 14.5 % (ref 11.5–15.5)
WBC: 6.3 10*3/uL (ref 4.0–10.5)

## 2016-04-14 MED ORDER — SODIUM CHLORIDE 0.9 % IV SOLN
2.0000 g | Freq: Once | INTRAVENOUS | Status: AC
  Administered 2016-04-14: 2 g via INTRAVENOUS
  Filled 2016-04-14: qty 2000

## 2016-04-14 MED ORDER — ONDANSETRON HCL 4 MG/2ML IJ SOLN
INTRAMUSCULAR | Status: AC
  Filled 2016-04-14: qty 2

## 2016-04-14 MED ORDER — PHENYLEPHRINE 8 MG IN D5W 100 ML (0.08MG/ML) PREMIX OPTIME
INJECTION | INTRAVENOUS | Status: AC
  Filled 2016-04-14: qty 100

## 2016-04-14 MED ORDER — SODIUM CHLORIDE 0.9 % IV SOLN: Freq: Once | INTRAVENOUS | Status: DC

## 2016-04-14 MED ORDER — CLINDAMYCIN PHOSPHATE 900 MG/50ML IV SOLN
900.0000 mg | Freq: Three times a day (TID) | INTRAVENOUS | Status: AC
  Administered 2016-04-15 (×2): 900 mg via INTRAVENOUS
  Filled 2016-04-14 (×2): qty 50

## 2016-04-14 MED ORDER — GENTAMICIN SULFATE 40 MG/ML IJ SOLN
Freq: Once | INTRAVENOUS | Status: AC
  Administered 2016-04-14: 23:00:00 via INTRAVENOUS
  Filled 2016-04-14: qty 9.5

## 2016-04-14 MED ORDER — SOD CITRATE-CITRIC ACID 500-334 MG/5ML PO SOLN
ORAL | Status: AC
  Administered 2016-04-14: 30 mL
  Filled 2016-04-14: qty 15

## 2016-04-14 MED ORDER — MORPHINE SULFATE-NACL 0.5-0.9 MG/ML-% IV SOSY
PREFILLED_SYRINGE | INTRAVENOUS | Status: AC
  Filled 2016-04-14: qty 1

## 2016-04-14 MED ORDER — OXYTOCIN 10 UNIT/ML IJ SOLN
INTRAMUSCULAR | Status: AC
  Filled 2016-04-14: qty 4

## 2016-04-14 MED ORDER — FENTANYL CITRATE (PF) 100 MCG/2ML IJ SOLN
INTRAMUSCULAR | Status: AC
  Filled 2016-04-14: qty 2

## 2016-04-14 NOTE — Progress Notes (Signed)
MD notified of pt's elevated BP.  

## 2016-04-14 NOTE — Progress Notes (Signed)
FACULTY PRACTICE ANTEPARTUM(COMPREHENSIVE) NOTE  Amanda [name] is a 25 y.o. G3P0020 at [redacted]w[redacted]d by best clinical estimate who is admitted for rupture of membranes, PROM.   Fetal presentation is breech. Length of Stay:  17  Days  Subjective:   Patient reports the fetal movement as active. Patient reports uterine contraction  activity as none. Patient reports  vaginal bleeding as none. Patient describes fluid per vagina as None.  Vitals:  Blood pressure 135/82, pulse 81, temperature 98.2 F (36.8 C), resp. rate 16, height  (1.549 m), weight 120 kg (264 lb 8 oz), last menstrual period 10/12/2015. Physical Examination:  General appearance - alert, well appearing, and in no distress Heart - normal rate and regular rhythm Abdomen - soft, nontender, nondistended Fundal Height:  size equals dates Cervical Exam: Not evaluated.  Extremities: extremities normal, atraumatic, no cyanosis or edema and Homans sign is negative, no sign of DVT Membranes:ruptured  Fetal Monitoring:     Fetal Heart Rate A  Mode External filed at 04/13/2016 2324  Baseline Rate (A) 145 bpm filed at 04/13/2016 2324  Variability 6-25 BPM filed at 04/13/2016 2324  Accelerations 10 x 10 filed at 04/13/2016 2324  Decelerations Variable filed at 04/13/2016 2324     Labs:  Results for orders placed or performed during the hospital encounter of 03/28/16 (from the past 24 hour(s))  CBC   Collection Time: 04/13/16  5:43 PM  Result Value Ref Range   WBC 9.0 4.0 - 10.5 K/uL   RBC 4.08 3.87 - 5.11 MIL/uL   Hemoglobin 12.7 12.0 - 15.0 g/dL   HCT 16.1 09.6 - 04.5 %   MCV 90.7 78.0 - 100.0 fL   MCH 31.1 26.0 - 34.0 pg   MCHC 34.3 30.0 - 36.0 g/dL   RDW 40.9 81.1 - 91.4 %   Platelets 203 150 - 400 K/uL  CBC   Collection Time: 04/14/16  6:00 AM  Result Value Ref Range   WBC 7.6 4.0 - 10.5 K/uL   RBC 4.16 3.87 - 5.11 MIL/uL   Hemoglobin 12.9 12.0 - 15.0 g/dL   HCT 78.2 95.6 - 21.3 %   MCV 90.9 78.0 - 100.0 fL   MCH 31.0 26.0 - 34.0 pg   MCHC 34.1 30.0 - 36.0 g/dL   RDW 08.6 57.8 - 46.9 %   Platelets 199 150 - 400 K/uL    Imaging Studies:     Currently EPIC will not allow sonographic studies to automatically populate into notes.  In the meantime, copy and paste results into note or free text.  Medications:  Scheduled . aspirin  81 mg Oral Daily  . docusate sodium  100 mg Oral Daily  . labetalol  200 mg Oral BID  . magnesium oxide  200 mg Oral Daily  . prenatal multivitamin  1 tablet Oral Q1200  . senna-docusate  1 tablet Oral BID  . sodium chloride flush  3 mL Intravenous Q12H  . vitamin C  1,000 mg Oral Daily   I have reviewed the patient's current medications.  ASSESSMENT: Patient Active Problem List   Diagnosis Date Noted  . Obesity affecting pregnancy, antepartum 04/12/2016  . Preterm premature rupture of membranes (PPROM) with unknown onset of labor 03/28/2016  . Elevated MSAFP (maternal serum alpha-fetoprotein) 03/15/2016  . Suspected cleft lip, antepartum 03/15/2016  . Supervision of high-risk pregnancy 03/11/2016  . Preterm premature rupture of membranes in second trimester 02/27/2016  . Morbid (severe) obesity due to excess calories (HCC) 01/01/2016  PLAN: Complete betamethasone course and continue to monitor for signs of labor or infection  Amanda [name] 04/14/2016,6:51 AM

## 2016-04-14 NOTE — Progress Notes (Signed)
I checked in on pt at nurse's referral.  She reported that she is doing fine and that she did not wish to talk at this time.  Chaplain Dyanne Carrel, Bcc Pager, [phone] 10:18 AM    04/14/16 1000  Clinical Encounter Type  Visited With Patient  Visit Type Follow-up  Referral From Nurse

## 2016-04-14 NOTE — Progress Notes (Signed)
Patient ID: Amanda [name], female   DOB: [birth year], 25 y.o.   MRN: [id number] FACULTY PRACTICE ANTEPARTUM(COMPREHENSIVE) NOTE:   CC Fever to 102.7 PPROM at 25 w3  Amanda [name] is a 25 y.o. G3P0020 at [redacted]w[redacted]d by early ultrasound who is admitted for rupture of membranes since 19 wks. She has been inpatient since 23 wk, received BMZ x 2 and has remained stable til tonight with temp noted this evening to 99, then 102.7, with associated tachycardia in to 170's, with no other identifiable sources of fever Fetal presentation is breech, confirmed by bedside u/s by me not. Length of Stay:  17  Days  Subjective: Pt last ate at 8 pm, pasta. Currently NPO Patient reports the fetal movement as active. Patient reports uterine contraction  activity as none. Patient reports  vaginal bleeding as none. Patient describes fluid per vagina as Clear.  Vitals:  Blood pressure 135/84, pulse 98, temperature (!) 102.7 F (39.3 C), resp. rate 18, height 5\' 1"  (1.549 m), weight 120.4 kg (265 lb 8 oz), last menstrual period 10/12/2015. Physical Examination:  General appearance - alert, well appearing, and in no distress Heart - normal rate and regular rhythm Abdomen - soft, nontender, nondistended Fundal Height:  size less than dates Cervical Exam: Not evaluated. and found to be / / and fetal presentation is breech. Extremities: extremities normal, atraumatic, no cyanosis or edema and Homans sign is negative, no sign of DVT with DTRs 2+ bilaterally Membranes:ruptured  Fetal Monitoring:  Baseline: 175 bpm  Labs:  Results for orders placed or performed during the hospital encounter of 03/28/16 (from the past 24 hour(s))  CBC   Collection Time: 04/14/16  6:00 AM  Result Value Ref Range   WBC 7.6 4.0 - 10.5 K/uL   RBC 4.16 3.87 - 5.11 MIL/uL   Hemoglobin 12.9 12.0 - 15.0 g/dL   HCT 50.5 39.7 - 67.3 %   MCV 90.9 78.0 - 100.0 fL   MCH 31.0 26.0 - 34.0 pg   MCHC 34.1 30.0 - 36.0 g/dL   RDW 41.9 37.9 - 02.4 %   Platelets 199 150 - 400 K/uL  Type and screen [facility]   Collection Time: 04/14/16  6:00 AM  Result Value Ref Range   ABO/RH(D) A POS    Antibody Screen NEG    Sample Expiration 04/17/2016   Prepare RBC   Collection Time: 04/14/16 10:04 PM  Result Value Ref Range   Order Confirmation ORDER PROCESSED BY BLOOD BANK     Imaging Studies:     Currently EPIC will not allow sonographic studies to automatically populate into notes.  In the meantime, copy and paste results into note or free text.  Medications:  Scheduled . sodium chloride   Intravenous Once  . ampicillin (OMNIPEN) IV  2 g Intravenous Once  . aspirin  81 mg Oral Daily  . [START ON 04/15/2016] clindamycin (CLEOCIN) IV  900 mg Intravenous Q8H  . docusate sodium  100 mg Oral Daily  . gentamicin (GARAMYCIN) with clindamycin (CLEOCIN) IV   Intravenous Once  . labetalol  200 mg Oral BID  . magnesium oxide  200 mg Oral Daily  . prenatal multivitamin  1 tablet Oral Q1200  . senna-docusate  1 tablet Oral BID  . sodium chloride flush  3 mL Intravenous Q12H  . vitamin C  1,000 mg Oral Daily   I have reviewed the patient's current medications  ASSESSMENT: Patient Active Problem List   Diagnosis Date Noted  .  Obesity affecting pregnancy, antepartum 04/12/2016  . Preterm premature rupture of membranes (PPROM) with unknown onset of labor 03/28/2016  . Elevated MSAFP (maternal serum alpha-fetoprotein) 03/15/2016  . Suspected cleft lip, antepartum 03/15/2016  . Supervision of high-risk pregnancy 03/11/2016  . Preterm premature rupture of membranes in second trimester 02/27/2016  . Morbid (severe) obesity due to excess calories (HCC) 01/01/2016  INtra amniotic inflammation and infection(III), aka Triple I  PLAN: Pt informed of need for delivery based on suspected infection. Blood cultures ordered Ampicillin 2g/Gentamycin /kg, Clindamycin 900 mg preop Cross match ordered x 2 units. Will consent for  cesarean delivery. Risks of bleeding , infection of maternal tissues injury to other organs, and need for future cesarean delivery discussed with the patient., who accepts delivery need.  [name] 04/14/2016,10:13 PM

## 2016-04-14 NOTE — Progress Notes (Signed)
MD called and made aware of pt's BP

## 2016-04-15 ENCOUNTER — Encounter (HOSPITAL_COMMUNITY)
Admission: AD | Disposition: A | Discharge status: Home / Self Care | Payer: Self-pay | Source: Ambulatory Visit | Attending: Obstetrics & Gynecology

## 2016-04-15 ENCOUNTER — Encounter (HOSPITAL_COMMUNITY): Payer: Self-pay

## 2016-04-15 DIAGNOSIS — O321XX0 Maternal care for breech presentation, not applicable or unspecified: Secondary | ICD-10-CM

## 2016-04-15 DIAGNOSIS — O411220 Chorioamnionitis, second trimester, not applicable or unspecified: Secondary | ICD-10-CM

## 2016-04-15 DIAGNOSIS — Z3A25 25 weeks gestation of pregnancy: Secondary | ICD-10-CM

## 2016-04-15 DIAGNOSIS — O42112 Preterm premature rupture of membranes, onset of labor more than 24 hours following rupture, second trimester: Secondary | ICD-10-CM

## 2016-04-15 LAB — CBC
HCT: 33.4 % — ABNORMAL LOW (ref 36.0–46.0)
Hemoglobin: 11.4 g/dL — ABNORMAL LOW (ref 12.0–15.0)
MCH: 31.1 pg (ref 26.0–34.0)
MCHC: 34.1 g/dL (ref 30.0–36.0)
MCV: 91.3 fL (ref 78.0–100.0)
Platelets: 132 10*3/uL — ABNORMAL LOW (ref 150–400)
RBC: 3.66 MIL/uL — ABNORMAL LOW (ref 3.87–5.11)
RDW: 14.7 % (ref 11.5–15.5)
WBC: 6.2 10*3/uL (ref 4.0–10.5)

## 2016-04-15 SURGERY — Surgical Case
Anesthesia: Spinal

## 2016-04-15 MED ORDER — SIMETHICONE 80 MG PO CHEW
80.0000 mg | CHEWABLE_TABLET | Freq: Three times a day (TID) | ORAL | Status: DC
  Administered 2016-04-15 – 2016-04-16 (×5): 80 mg via ORAL
  Filled 2016-04-15 (×5): qty 1

## 2016-04-15 MED ORDER — OXYTOCIN 10 UNIT/ML IJ SOLN
INTRAMUSCULAR | Status: AC
  Filled 2016-04-15: qty 4

## 2016-04-15 MED ORDER — MEPERIDINE HCL 25 MG/ML IJ SOLN: 6.2500 mg | INTRAMUSCULAR | Status: DC | PRN

## 2016-04-15 MED ORDER — OXYTOCIN 10 UNIT/ML IJ SOLN
INTRAVENOUS | Status: DC | PRN
  Administered 2016-04-14: 40 [IU] via INTRAVENOUS

## 2016-04-15 MED ORDER — NALBUPHINE HCL 10 MG/ML IJ SOLN: 5.0000 mg | Freq: Once | INTRAMUSCULAR | Status: DC | PRN

## 2016-04-15 MED ORDER — NALBUPHINE HCL 10 MG/ML IJ SOLN: 5.0000 mg | INTRAMUSCULAR | Status: DC | PRN

## 2016-04-15 MED ORDER — OXYCODONE-ACETAMINOPHEN 5-325 MG PO TABS
1.0000 | ORAL_TABLET | ORAL | Status: DC | PRN
  Administered 2016-04-15 – 2016-04-16 (×3): 1 via ORAL
  Filled 2016-04-15 (×4): qty 1

## 2016-04-15 MED ORDER — DEXTROSE 5 % IV SOLN
INTRAVENOUS | Status: DC | PRN
  Administered 2016-04-14: 100 mL via INTRAVENOUS

## 2016-04-15 MED ORDER — SCOPOLAMINE 1 MG/3DAYS TD PT72
MEDICATED_PATCH | TRANSDERMAL | Status: DC | PRN
  Administered 2016-04-15: 1 via TRANSDERMAL

## 2016-04-15 MED ORDER — COCONUT OIL OIL: 1.0000 "application " | TOPICAL_OIL | Status: DC | PRN

## 2016-04-15 MED ORDER — FENTANYL CITRATE (PF) 100 MCG/2ML IJ SOLN
INTRAMUSCULAR | Status: DC | PRN
  Administered 2016-04-14: 20 ug via INTRATHECAL
  Administered 2016-04-15: 80 ug via INTRAVENOUS

## 2016-04-15 MED ORDER — OXYTOCIN 40 UNITS IN LACTATED RINGERS INFUSION - SIMPLE MED
2.5000 [IU]/h | INTRAVENOUS | Status: AC
  Administered 2016-04-15: 2.5 [IU]/h via INTRAVENOUS
  Filled 2016-04-15: qty 1000

## 2016-04-15 MED ORDER — ACETAMINOPHEN 500 MG PO TABS: 1000.0000 mg | ORAL_TABLET | Freq: Four times a day (QID) | ORAL | Status: DC

## 2016-04-15 MED ORDER — SENNOSIDES-DOCUSATE SODIUM 8.6-50 MG PO TABS
2.0000 | ORAL_TABLET | ORAL | Status: DC
  Administered 2016-04-15: 2 via ORAL
  Filled 2016-04-15: qty 2

## 2016-04-15 MED ORDER — TETANUS-DIPHTH-ACELL PERTUSSIS 5-2.5-18.5 LF-MCG/0.5 IM SUSP: 0.5000 mL | Freq: Once | INTRAMUSCULAR | Status: DC

## 2016-04-15 MED ORDER — ONDANSETRON HCL 4 MG/2ML IJ SOLN
INTRAMUSCULAR | Status: AC
  Filled 2016-04-15: qty 2

## 2016-04-15 MED ORDER — KETOROLAC TROMETHAMINE 30 MG/ML IJ SOLN
INTRAMUSCULAR | Status: AC
  Administered 2016-04-15: 30 mg via INTRAMUSCULAR
  Filled 2016-04-15: qty 1

## 2016-04-15 MED ORDER — ONDANSETRON HCL 4 MG/2ML IJ SOLN: 4.0000 mg | Freq: Three times a day (TID) | INTRAMUSCULAR | Status: DC | PRN

## 2016-04-15 MED ORDER — SIMETHICONE 80 MG PO CHEW
80.0000 mg | CHEWABLE_TABLET | ORAL | Status: DC | PRN
  Administered 2016-04-15: 80 mg via ORAL
  Filled 2016-04-15: qty 1

## 2016-04-15 MED ORDER — KETOROLAC TROMETHAMINE 30 MG/ML IJ SOLN: 30.0000 mg | Freq: Four times a day (QID) | INTRAMUSCULAR | Status: DC | PRN

## 2016-04-15 MED ORDER — NALOXONE HCL 0.4 MG/ML IJ SOLN: 0.4000 mg | INTRAMUSCULAR | Status: DC | PRN

## 2016-04-15 MED ORDER — IBUPROFEN 600 MG PO TABS
600.0000 mg | ORAL_TABLET | Freq: Four times a day (QID) | ORAL | Status: DC
  Administered 2016-04-15 – 2016-04-16 (×6): 600 mg via ORAL
  Filled 2016-04-15 (×6): qty 1

## 2016-04-15 MED ORDER — BUPIVACAINE IN DEXTROSE 0.75-8.25 % IT SOLN
INTRATHECAL | Status: DC | PRN
  Administered 2016-04-14: 1.2 mg via INTRATHECAL

## 2016-04-15 MED ORDER — MEPERIDINE HCL 25 MG/ML IJ SOLN
INTRAMUSCULAR | Status: DC | PRN
  Administered 2016-04-15 (×2): 12.5 mg via INTRAVENOUS

## 2016-04-15 MED ORDER — DIPHENHYDRAMINE HCL 25 MG PO CAPS
25.0000 mg | ORAL_CAPSULE | ORAL | Status: DC | PRN
  Filled 2016-04-15: qty 1

## 2016-04-15 MED ORDER — ONDANSETRON HCL 4 MG/2ML IJ SOLN
INTRAMUSCULAR | Status: DC | PRN
  Administered 2016-04-15: 4 mg via INTRAVENOUS

## 2016-04-15 MED ORDER — KETOROLAC TROMETHAMINE 30 MG/ML IJ SOLN
30.0000 mg | Freq: Four times a day (QID) | INTRAMUSCULAR | Status: DC | PRN
  Administered 2016-04-15: 30 mg via INTRAMUSCULAR

## 2016-04-15 MED ORDER — MORPHINE SULFATE-NACL 0.5-0.9 MG/ML-% IV SOSY
PREFILLED_SYRINGE | INTRAVENOUS | Status: DC | PRN
  Administered 2016-04-14: .2 mg via EPIDURAL
  Administered 2016-04-15: .3 mg via EPIDURAL

## 2016-04-15 MED ORDER — WITCH HAZEL-GLYCERIN EX PADS: 1.0000 "application " | MEDICATED_PAD | CUTANEOUS | Status: DC | PRN

## 2016-04-15 MED ORDER — SCOPOLAMINE 1 MG/3DAYS TD PT72: 1.0000 | MEDICATED_PATCH | Freq: Once | TRANSDERMAL | Status: DC

## 2016-04-15 MED ORDER — ACETAMINOPHEN 325 MG PO TABS
650.0000 mg | ORAL_TABLET | ORAL | Status: DC | PRN
  Administered 2016-04-15: 650 mg via ORAL

## 2016-04-15 MED ORDER — IBUPROFEN 600 MG PO TABS: 600.0000 mg | ORAL_TABLET | Freq: Four times a day (QID) | ORAL | Status: DC | PRN

## 2016-04-15 MED ORDER — DIBUCAINE 1 % RE OINT: 1.0000 "application " | TOPICAL_OINTMENT | RECTAL | Status: DC | PRN

## 2016-04-15 MED ORDER — LACTATED RINGERS IV SOLN
INTRAVENOUS | Status: DC | PRN
  Administered 2016-04-14: 23:00:00 via INTRAVENOUS

## 2016-04-15 MED ORDER — SIMETHICONE 80 MG PO CHEW
80.0000 mg | CHEWABLE_TABLET | ORAL | Status: DC
  Administered 2016-04-15: 80 mg via ORAL
  Filled 2016-04-15: qty 1

## 2016-04-15 MED ORDER — SCOPOLAMINE 1 MG/3DAYS TD PT72
MEDICATED_PATCH | TRANSDERMAL | Status: AC
  Filled 2016-04-15: qty 1

## 2016-04-15 MED ORDER — SODIUM CHLORIDE 0.9 % IR SOLN
Status: DC | PRN
  Administered 2016-04-15: 1000 mL

## 2016-04-15 MED ORDER — LACTATED RINGERS IV SOLN
INTRAVENOUS | Status: DC
  Administered 2016-04-15: 07:00:00 via INTRAVENOUS

## 2016-04-15 MED ORDER — LACTATED RINGERS IV SOLN
INTRAVENOUS | Status: DC | PRN
  Administered 2016-04-14 (×2): via INTRAVENOUS

## 2016-04-15 MED ORDER — SODIUM CHLORIDE 0.9% FLUSH: 3.0000 mL | INTRAVENOUS | Status: DC | PRN

## 2016-04-15 MED ORDER — DIPHENHYDRAMINE HCL 50 MG/ML IJ SOLN: 12.5000 mg | INTRAMUSCULAR | Status: DC | PRN

## 2016-04-15 MED ORDER — MENTHOL 3 MG MT LOZG: 1.0000 | LOZENGE | OROMUCOSAL | Status: DC | PRN

## 2016-04-15 MED ORDER — MEPERIDINE HCL 25 MG/ML IJ SOLN
INTRAMUSCULAR | Status: AC
  Filled 2016-04-15: qty 1

## 2016-04-15 MED ORDER — DIPHENHYDRAMINE HCL 25 MG PO CAPS: 25.0000 mg | ORAL_CAPSULE | Freq: Four times a day (QID) | ORAL | Status: DC | PRN

## 2016-04-15 MED ORDER — NALOXONE HCL 2 MG/2ML IJ SOSY
1.0000 ug/kg/h | PREFILLED_SYRINGE | INTRAVENOUS | Status: DC | PRN
  Filled 2016-04-15: qty 2

## 2016-04-15 MED ORDER — MORPHINE SULFATE (PF) 0.5 MG/ML IJ SOLN: INTRAMUSCULAR | Status: DC | PRN

## 2016-04-15 SURGICAL SUPPLY — 37 items
BENZOIN TINCTURE PRP APPL 2/3 (GAUZE/BANDAGES/DRESSINGS) ×3 IMPLANT
CHLORAPREP W/TINT 26ML (MISCELLANEOUS) ×3 IMPLANT
CLAMP CORD UMBIL (MISCELLANEOUS) IMPLANT
CLOSURE WOUND 1/2 X4 (GAUZE/BANDAGES/DRESSINGS) ×1
CLOTH BEACON ORANGE TIMEOUT ST (SAFETY) ×3 IMPLANT
DRSG OPSITE POSTOP 4X10 (GAUZE/BANDAGES/DRESSINGS) ×3 IMPLANT
ELECT REM PT RETURN 9FT ADLT (ELECTROSURGICAL) ×3
ELECTRODE REM PT RTRN 9FT ADLT (ELECTROSURGICAL) ×1 IMPLANT
EXTRACTOR VACUUM KIWI (MISCELLANEOUS) IMPLANT
GLOVE BIO SURGEON ST LM GN SZ9 (GLOVE) ×3 IMPLANT
GLOVE BIOGEL PI IND STRL 7.0 (GLOVE) ×3 IMPLANT
GLOVE BIOGEL PI IND STRL 9 (GLOVE) ×1 IMPLANT
GLOVE BIOGEL PI INDICATOR 7.0 (GLOVE) ×6
GLOVE BIOGEL PI INDICATOR 9 (GLOVE) ×2
GOWN STRL REUS W/TWL 2XL LVL3 (GOWN DISPOSABLE) ×3 IMPLANT
GOWN STRL REUS W/TWL LRG LVL3 (GOWN DISPOSABLE) ×6 IMPLANT
NEEDLE HYPO 25X5/8 SAFETYGLIDE (NEEDLE) IMPLANT
NS IRRIG 1000ML POUR BTL (IV SOLUTION) ×3 IMPLANT
PACK C SECTION WH (CUSTOM PROCEDURE TRAY) ×3 IMPLANT
PAD ABD 7.5X8 STRL (GAUZE/BANDAGES/DRESSINGS) ×3 IMPLANT
PAD OB MATERNITY 4.3X12.25 (PERSONAL CARE ITEMS) ×3 IMPLANT
PENCIL SMOKE EVAC W/HOLSTER (ELECTROSURGICAL) ×3 IMPLANT
RTRCTR C-SECT PINK 25CM LRG (MISCELLANEOUS) IMPLANT
RTRCTR C-SECT PINK 34CM XLRG (MISCELLANEOUS) IMPLANT
SPONGE GAUZE 4X4 12PLY (GAUZE/BANDAGES/DRESSINGS) ×6 IMPLANT
STRIP CLOSURE SKIN 1/2X4 (GAUZE/BANDAGES/DRESSINGS) ×2 IMPLANT
SUT MNCRL 0 VIOLET CTX 36 (SUTURE) ×3 IMPLANT
SUT MONOCRYL 0 CTX 36 (SUTURE) ×6
SUT VIC AB 0 CT1 27 (SUTURE) ×2
SUT VIC AB 0 CT1 27XBRD ANBCTR (SUTURE) ×1 IMPLANT
SUT VIC AB 2-0 CT1 27 (SUTURE) ×4
SUT VIC AB 2-0 CT1 TAPERPNT 27 (SUTURE) ×2 IMPLANT
SUT VIC AB 4-0 KS 27 (SUTURE) ×3 IMPLANT
SYR BULB IRRIGATION 50ML (SYRINGE) IMPLANT
TAPE CLOTH SURG 4X10 WHT LF (GAUZE/BANDAGES/DRESSINGS) ×3 IMPLANT
TOWEL OR 17X24 6PK STRL BLUE (TOWEL DISPOSABLE) ×3 IMPLANT
TRAY FOLEY CATH SILVER 14FR (SET/KITS/TRAYS/PACK) ×3 IMPLANT

## 2016-04-15 NOTE — Brief Op Note (Signed)
03/28/2016 - 04/15/2016  12:58 AM  PATIENT:  Amanda [name]  25 y.o. female  PRE-OPERATIVE DIAGNOSIS:  primary cesarean section for chorioamnionitis, breech presentation, and premature prolonged rupture of membranes, at 25 weeks 4 days  POST-OPERATIVE DIAGNOSIS:  primary cesarean section for chorioamnionitis, breech presentation, and premature prolonged rupture of membranes when he 5 weeks 4 days, delivered  PROCEDURE:  Procedure(s): CESAREAN SECTION (N/A) classical cesarean section  SURGEON:  Surgeon(s) and Role:    * Tilda Burrow, MD - Primary  PHYSICIAN ASSISTANT:   ASSISTANTS:  MS 3  ANESTHESIA:   spinal  EBL:  Total I/O In: 500 [I.V.:500] Out: 700 [Urine:100; Blood:600]  BLOOD ADMINISTERED:none  DRAINS: Urinary Catheter (Foley)   LOCAL MEDICATIONS USED:  NONE  SPECIMEN:  Source of Specimen:  Placenta to pathology  DISPOSITION OF SPECIMEN:  PATHOLOGY  COUNTS:  YES  TOURNIQUET:  * No tourniquets in log *  DICTATION: .Dragon Dictation  PLAN OF CARE: Has active admission  PATIENT DISPOSITION:  PACU - hemodynamically stable.   Delay start of Pharmacological VTE agent (>24hrs) due to surgical blood loss or risk of bleeding: not applicable

## 2016-04-15 NOTE — Addendum Note (Signed)
Addendum  created 04/15/16 1635 by Orlie Pollen, CRNA   Sign clinical note

## 2016-04-15 NOTE — Progress Notes (Signed)
Subjective: Postpartum Day #1 (7 hours post op): Primary Classical Cesarean Delivery at [redacted]w[redacted]d for chorioamnionitis, PPROM, breech CHTN  Patient reports tolerating PO sips. Not yet OOB. Pain controlled with PCA pump.   Objective: Vital signs in last 24 hours: Temp:  [97.9 F (36.6 C)-102.7 F (39.3 C)] 98.1 F (36.7 C) (07/27 0556) Pulse Rate:  [75-108] 75 (07/27 0556) Resp:  [16-27] 20 (07/27 0556) BP: (119-140)/(72-94) 121/72 (07/27 0556) SpO2:  [95 %-98 %] 95 % (07/27 0556) Weight:  [120.4 kg (265 lb 8 oz)] 120.4 kg (265 lb 8 oz) (07/26 1275)  Physical Exam:  General: alert, cooperative, morbidly obese and appears sad Lochia: appropriate Uterine Fundus: firm Incision: pressure dressing C/D/I DVT Evaluation: SCDs Foley to clear drainage.     Intake/Output Summary (Last 24 hours) at 04/15/16 0754 Last data filed at 04/15/16 1700  Gross per 24 hour  Intake           3396.5 ml  Output              850 ml  Net           2546.5 ml    Recent Labs  04/14/16 2201 04/15/16 0525  HGB 12.7 11.4*  HCT 37.4 33.4*   . sodium chloride   Intravenous Once  . aspirin  81 mg Oral Daily  . clindamycin (CLEOCIN) IV  900 mg Intravenous Q8H  . docusate sodium  100 mg Oral Daily  . ibuprofen  600 mg Oral Q6H  . labetalol  200 mg Oral BID  . magnesium oxide  200 mg Oral Daily  . prenatal multivitamin  1 tablet Oral Q1200  . scopolamine  1 patch Transdermal Once  . senna-docusate  1 tablet Oral BID  . [START ON 04/16/2016] senna-docusate  2 tablet Oral Q24H  . simethicone  80 mg Oral TID PC  . [START ON 04/16/2016] simethicone  80 mg Oral Q24H  . sodium chloride flush  3 mL Intravenous Q12H  . [START ON 04/16/2016] Tdap  0.5 mL Intramuscular Once  . vitamin C  1,000 mg Oral Daily   Assessment/Plan: Status post Cesarean section. Stable early postop Grieving appropriately BPs stable Continue current care.   Mahathi Pokorney 04/15/2016, 7:45 AM

## 2016-04-15 NOTE — Anesthesia Postprocedure Evaluation (Signed)
Anesthesia Post Note  Patient: Amanda [name]  Procedure(s) Performed: Procedure(s) (LRB): CESAREAN SECTION (N/A)  Patient location during evaluation: [facility] Anesthesia Type: Epidural Level of consciousness: awake and alert Pain management: pain level controlled Vital Signs Assessment: post-procedure vital signs reviewed and stable Respiratory status: spontaneous breathing, nonlabored ventilation and respiratory function stable Cardiovascular status: stable Postop Assessment: no headache, no backache and epidural receding Anesthetic complications: no     Last Vitals:  Vitals:   04/15/16 1000 04/15/16 1400  BP:  (!) 103/54  Pulse: 69 80  Resp: 20 20  Temp: 36.9 C 36.6 C    Last Pain:  Vitals:   04/15/16 1436  TempSrc:   PainSc: 3    Pain Goal: Patients Stated Pain Goal: 3 (04/15/16 1436)               [name]

## 2016-04-15 NOTE — Transfer of Care (Signed)
Immediate Anesthesia Transfer of Care Note  Patient: Amanda [name]  Procedure(s) Performed: Procedure(s): CESAREAN SECTION (N/A)  Patient Location: PACU  Anesthesia Type:Spinal  Level of Consciousness: awake, alert , oriented and patient cooperative  Airway & Oxygen Therapy: Patient Spontanous Breathing  Post-op Assessment: Report given to RN and Post -op Vital signs reviewed and stable  Post vital signs: Reviewed and stable  Last Vitals:  Vitals:   04/14/16 2037 04/14/16 2120  BP: 135/84   Pulse: 98   Resp: 18   Temp: 37.7 C (!) 39.3 C    Last Pain:  Vitals:   04/14/16 2238  TempSrc:   PainSc: 0-No pain      Patients Stated Pain Goal: 3 (04/12/16 2005)  Complications: No apparent anesthesia complications

## 2016-04-15 NOTE — Progress Notes (Signed)
Follow up visit with Amanda [name] who was seen by [facility] yesterday.  Since their visit, Rini's son Amanda [name] was born and died shortly after.  Per her nurse, Amanda [name] has been very flat and disengaged.  She had her dog come to visit earlier today and had several guests in her room.  I checked in on her shortly after they left and she was still resistant to talking. She did say that the visit with her dog was helpful and that she was in pain.  She shared that they do not plan to have a funeral, but do want to have [employer].  I informed them that [facility] offers an affordable rate and she was appreciative of the information.  I offered to come back in the morning and she was okay with that.    Please page as further needs arise.  Amanda [name]. Amanda [name], M.Div. [facility] Chaplain Pager [phone] Office [phone]     04/15/16 1522  Clinical Encounter Type  Visited With Patient and family together  Visit Type Follow-up;Spiritual support  Referral From Nurse  Spiritual Encounters  Spiritual Needs Emotional  Stress Factors  Patient Stress Factors Loss  Family Stress Factors Not reviewed

## 2016-04-15 NOTE — Anesthesia Postprocedure Evaluation (Signed)
Anesthesia Post Note  Patient: Amanda [name]  Procedure(s) Performed: Procedure(s) (LRB): CESAREAN SECTION (N/A)  Patient location during evaluation: Mother Baby Anesthesia Type: Epidural Level of consciousness: awake and alert Pain management: pain level controlled Vital Signs Assessment: post-procedure vital signs reviewed and stable Respiratory status: spontaneous breathing, nonlabored ventilation and respiratory function stable Cardiovascular status: stable Postop Assessment: no headache, no backache and epidural receding Anesthetic complications: no     Last Vitals:  Vitals:   04/15/16 0200 04/15/16 0228  BP:  119/79  Pulse:  (!) 108  Resp:  18  Temp: 37.2 C 37.3 C    Last Pain:  Vitals:   04/15/16 0228  TempSrc: Oral  PainSc:    Pain Goal: Patients Stated Pain Goal: 3 (04/12/16 2005)               Jaion Lagrange A

## 2016-04-15 NOTE — Anesthesia Procedure Notes (Signed)
Spinal  Patient location during procedure: OR Start time: 04/15/2016 11:25 PM Staffing Anesthesiologist: Dilcia Rybarczyk Preanesthetic Checklist Completed: patient identified, site marked, surgical consent, pre-op evaluation, timeout performed, IV checked, risks and benefits discussed and monitors and equipment checked Spinal Block Patient position: sitting Prep: DuraPrep Patient monitoring: heart rate and blood pressure Approach: midline Location: L3-4 Injection technique: single-shot Needle Needle type: Pencan  Needle gauge: 24 G Needle length: 9 cm Needle insertion depth: 5 cm Assessment Sensory level: T6

## 2016-04-15 NOTE — Lactation Note (Addendum)
Lactation Consultation Note  Patient Name: Amanda [name] IRJJO'A Date: 04/15/2016   Nursing staff requested that I not visit patient & provide lactation suppression info at this time (pt has been very upset).  Lurline Hare [facility] 04/15/2016, 10:40 PM   Addendum 04/16/16 at 0015: Around 2300 I received a call from nursing that patient did not wish to see me. I did write the recipe for sage tea and give to RN to provide to patient (along w/a phone number to reach me if she has questions about breast care once she goes home). RN to also provide more specific instructions for use of cabbage leaves. KR

## 2016-04-15 NOTE — Op Note (Signed)
03/28/2016 - 04/15/2016  12:58 AM  PATIENT:  Amanda [name]  25 y.o. female  PRE-OPERATIVE DIAGNOSIS:  primary cesarean section for chorioamnionitis, breech presentation, and premature prolonged rupture of membranes, at 25 weeks 4 days  POST-OPERATIVE DIAGNOSIS:  primary cesarean section for chorioamnionitis, breech presentation, and premature prolonged rupture of membranes when he 5 weeks 4 days, delivered  PROCEDURE:  Procedure(s): CESAREAN SECTION (N/A) classical cesarean section  SURGEON:  Surgeon(s) and Role:    * Tilda Burrow, MD - Primary  PHYSICIAN ASSISTANT:   ASSISTANTS:  MS 3breech presentation ANESTHESIA:   spinal  EBL:  Total I/O In: 500 [I.V.:500] Out: 700 [Urine:100; Blood:600]  BLOOD ADMINISTERED:none  DRAINS: Urinary Catheter (Foley)   LOCAL MEDICATIONS USED:  NONE  SPECIMEN:  Source of Specimen:  Placenta to pathology  DISPOSITION OF SPECIMEN:  PATHOLOGY  COUNTS:  YES  TOURNIQUET:  * No tourniquets in log *  DICTATION: .Dragon Dictation  PLAN OF CARE: Has active admission  PATIENT DISPOSITION:  PACU - hemodynamically stable.   Delay start of Pharmacological VTE agent (>24hrs) due to surgical blood loss or risk of bleeding: not applicable Indications intra-amniotic infection with fetal tachycardia to 195 bpm and temperature to 102.7. No other obvious source of fever documented breech presentation Details of procedure patient was taken operating room prepped and draped after spinal anesthesia introduced with some difficulty and excellent analgesic effect achieved transverse lower abdominal incision was made approximately 12-14 cm along in the transverse position chart dissection to the fascia which was opened transversely and dissected off the underlying rectus muscles and the peritoneum entered in the midline. Alexis wound retractor was positioned. The uterus could be identified and the lower uterine segment palpated and fetal parts suspected to be  pelvis and lower extremities was noted in the area of the lower uterine segment. It was felt that a vertical incision was necessary. Began over this fetal small parts extended upward sufficiently to deliver the baby from the breech presentation here at of note the amnionic fluid was absent and there was no appreciable malodor upon opening the uterus however there was expulsion of some old brown apparently old blood suggestive of previous abruption of a long-standing duration duration. Infant was delivered easily and passed to waiting pediatricians. The baby did not cry promptly see pediatric notes regarding the baby. The placenta was delivered after the uterus was exteriorized, and it is vigorously swabbed out and then irrigated. Anterior uterine surface was closed using 2 layer closure of the uterine defect,the  running suture in the deep layer of the musculature and then a more superficial second continuous running layer of 0 Monocryl. One horizontal mattress suture was necessary to complete hemostasis. Irrigated in the abdomen was irrigated before removal of instruments, closure of the peritoneum anteriorly with 0 Vicryl fascia closed with 0 Vicryl as well, and patient sent to recovery

## 2016-04-15 NOTE — Progress Notes (Signed)
Pt's 10:00 BP was 99/60, pulse 69.  Phoned Dr. Holly Bodily to ask if morning labetalol should be given or held.  She stated to hold dose this AM.

## 2016-04-15 NOTE — Anesthesia Preprocedure Evaluation (Signed)
Anesthesia Evaluation  Patient identified by MRN, date of birth, ID band Patient awake    Reviewed: Allergy & Precautions, NPO status , Patient's Chart, lab work & pertinent test results, Unable to perform ROS - Chart review onlyPreop documentation limited or incomplete due to emergent nature of procedure.  Airway Mallampati: II  TM Distance: >3 FB Neck ROM: Full  Mouth opening: Limited Mouth Opening  Dental  (+) Teeth Intact, Dental Advisory Given   Pulmonary    breath sounds clear to auscultation       Cardiovascular  Rhythm:Regular Rate:Normal     Neuro/Psych    GI/Hepatic   Endo/Other  Morbid obesity  Renal/GU      Musculoskeletal   Abdominal   Peds  Hematology   Anesthesia Other Findings   Reproductive/Obstetrics (+) Pregnancy                             Anesthesia Physical Anesthesia Plan  ASA: III and emergent  Anesthesia Plan: Spinal   Post-op Pain Management:    Induction:   Airway Management Planned:   Additional Equipment:   Intra-op Plan:   Post-operative Plan:   Informed Consent: I have reviewed the patients History and Physical, chart, labs and discussed the procedure including the risks, benefits and alternatives for the proposed anesthesia with the patient or authorized representative who has indicated his/her understanding and acceptance.   Dental advisory given  Plan Discussed with: Anesthesiologist, CRNA and Surgeon  Anesthesia Plan Comments:         Anesthesia Quick Evaluation

## 2016-04-16 DIAGNOSIS — O162 Unspecified maternal hypertension, second trimester: Secondary | ICD-10-CM

## 2016-04-16 DIAGNOSIS — Z3A23 23 weeks gestation of pregnancy: Secondary | ICD-10-CM

## 2016-04-16 DIAGNOSIS — K8681 Exocrine pancreatic insufficiency: Secondary | ICD-10-CM

## 2016-04-16 DIAGNOSIS — O9962 Diseases of the digestive system complicating childbirth: Secondary | ICD-10-CM

## 2016-04-16 DIAGNOSIS — O99824 Streptococcus B carrier state complicating childbirth: Secondary | ICD-10-CM

## 2016-04-16 DIAGNOSIS — O328XX0 Maternal care for other malpresentation of fetus, not applicable or unspecified: Secondary | ICD-10-CM

## 2016-04-16 DIAGNOSIS — O42912 Preterm premature rupture of membranes, unspecified as to length of time between rupture and onset of labor, second trimester: Secondary | ICD-10-CM

## 2016-04-16 DIAGNOSIS — K900 Celiac disease: Secondary | ICD-10-CM

## 2016-04-16 MED ORDER — IBUPROFEN 800 MG PO TABS: 800.0000 mg | ORAL_TABLET | Freq: Three times a day (TID) | ORAL | 0 refills | Status: DC | PRN

## 2016-04-16 MED ORDER — OXYCODONE-ACETAMINOPHEN 5-325 MG PO TABS: 1.0000 | ORAL_TABLET | Freq: Four times a day (QID) | ORAL | 0 refills | Status: DC | PRN

## 2016-04-16 NOTE — Discharge Summary (Signed)
OB Discharge Summary     Patient Name: Amanda [name] DOB: December 22, 1990 MRN: [id number]  Date of admission: 03/28/2016 Delivering MD: Tilda Burrow   Date of discharge: 04/16/2016  Admitting diagnosis: 23 WKS, ROM Intrauterine pregnancy: [redacted]w[redacted]d     Secondary diagnosis:  Active Problems:   Supervision of high-risk pregnancy   Elevated MSAFP (maternal serum alpha-fetoprotein)   Suspected cleft lip, antepartum   Morbid (severe) obesity due to excess calories (HCC)   Obesity affecting pregnancy, antepartum  Additional problems: Neonatal death      Discharge diagnosis: Preterm Pregnancy Delivered                                                                                                Post partum procedures:none   Augmentation: NA  Complications: Intrauterine Inflammation or infection (Chorioamniotis)  Hospital course:  Onset of Labor With Unplanned C/S  25 y.o. yo [id number] at [redacted]w[redacted]d was admitted in Latent Labor on 03/28/2016. Patient had a labor course significant for fever. Membrane Rupture Time/Date:   ,02/25/2016   The patient went for cesarean section due to chorio, breech , and delivered a Viable infant,04/14/2016  Details of operation can be found in separate operative note. Patient had an uncomplicated postpartum course.  She is ambulating,tolerating a regular diet, passing flatus, and urinating well.  Patient is discharged home in stable condition 04/16/16.   Physical exam Vitals:   04/15/16 1400 04/15/16 1700 04/15/16 2155 04/16/16 0548  BP: (!) 103/54 127/77 107/69 (!) 137/96  Pulse: 80 85 91 (!) 107  Resp: 20 19 20 18   Temp: 97.8 F (36.6 C) 98.5 F (36.9 C) 97.5 F (36.4 C) 99.7 F (37.6 C)  TempSrc: Oral Oral Oral Oral  SpO2: 98% 99% 97%   Weight:      Height:       General: alert and cooperative Lochia: appropriate Uterine Fundus: firm Incision: Healing well with no significant drainage DVT Evaluation: No evidence of DVT seen on physical exam. Labs: Lab  Results  Component Value Date   WBC 6.2 04/15/2016   HGB 11.4 (L) 04/15/2016   HCT 33.4 (L) 04/15/2016   MCV 91.3 04/15/2016   PLT 132 (L) 04/15/2016   CMP Latest Ref Rng & Units 03/25/2016  Glucose 65 - 99 mg/dL 569(V)  BUN 6 - 20 mg/dL <9(Y)  Creatinine 8.01 - 1.00 mg/dL 6.55(V)  Sodium 748 - 270 mmol/L 135  Potassium 3.5 - 5.1 mmol/L 3.8  Chloride 101 - 111 mmol/L 107  CO2 22 - 32 mmol/L 21(L)  Calcium 8.9 - 10.3 mg/dL 9.2  Total Protein 6.5 - 8.1 g/dL 6.6  Total Bilirubin 0.3 - 1.2 mg/dL 0.3  Alkaline Phos 38 - 126 U/L 57  AST 15 - 41 U/L 15  ALT 14 - 54 U/L 13(L)    Discharge instruction: per After Visit Summary and "Baby and Me Booklet".  After visit meds:    Medication List    STOP taking these medications   magnesium 30 MG tablet     TAKE these medications   CONCEPT DHA 53.5-38-1 MG Caps Take  1 capsule by mouth daily.   ibuprofen 800 MG tablet Commonly known as:  ADVIL,MOTRIN Take 1 tablet (800 mg total) by mouth every 8 (eight) hours as needed.   oxyCODONE-acetaminophen 5-325 MG tablet Commonly known as:  PERCOCET/ROXICET Take 1-2 tablets by mouth every 6 (six) hours as needed for severe pain.   vitamin C 1000 MG tablet Take 1,000 mg by mouth daily.     ASK your doctor about these medications   labetalol 200 MG tablet Commonly known as:  NORMODYNE Take 1 tablet (200 mg total) by mouth 2 (two) times daily.       Diet: routine diet  Activity: Advance as tolerated. Pelvic rest for 6 weeks.   Outpatient follow up:2 weeks Follow up Appt:No future appointments. Follow up Visit:No Follow-up on file.  Postpartum contraception: Not Discussed  Newborn Data: Live born female  Birth Weight: 1 lb 9.8 oz (730 g) APGAR: 1, 2  Baby Feeding: NA Disposition:morgue   04/16/2016 Tawnya Crook, CNM

## 2016-04-16 NOTE — Progress Notes (Signed)
Pt discharged to home with husband.   Condition stable. Discharge instructions reviewed with pt and husband together including information about Heartstrings.  Pt ambulated to car with RN.  No equipment for home ordered at discharge.

## 2016-04-18 LAB — TYPE AND SCREEN
ABO/RH(D): A POS
Antibody Screen: NEGATIVE
UNIT DIVISION: 0
Unit division: 0

## 2016-04-19 LAB — CULTURE, BLOOD (ROUTINE X 2)
CULTURE: NO GROWTH
Culture: NO GROWTH

## 2016-04-30 ENCOUNTER — Ambulatory Visit: Payer: Medicaid Other

## 2016-04-30 DIAGNOSIS — Z0130 Encounter for examination of blood pressure without abnormal findings: Secondary | ICD-10-CM

## 2016-04-30 MED ORDER — AMLODIPINE BESYLATE 5 MG PO TABS: 5.0000 mg | ORAL_TABLET | Freq: Every day | ORAL | 1 refills | Status: DC

## 2016-04-30 NOTE — Progress Notes (Signed)
Patient presented today for a blood pressure check. B/P 144/108 patient stated she has not taken her blood pressure medication since leaving the hospital. She stated they cause her to be fatigue. Per Dr.Arnold we can call in Norvasc 5 mg to her pharmacy. Patient will follow up in two weeks for another blood pressure check. Patient verbalizes understanding at this time.

## 2016-05-07 ENCOUNTER — Encounter: Payer: Self-pay | Admitting: *Deleted

## 2016-06-09 ENCOUNTER — Encounter: Payer: Self-pay | Admitting: Obstetrics & Gynecology

## 2016-06-09 ENCOUNTER — Ambulatory Visit: Payer: Medicaid Other | Admitting: Clinical

## 2016-06-09 ENCOUNTER — Ambulatory Visit (INDEPENDENT_AMBULATORY_CARE_PROVIDER_SITE_OTHER): Payer: Medicaid Other | Admitting: Obstetrics & Gynecology

## 2016-06-09 VITALS — BP 135/94 | HR 104 | Ht 62.0 in | Wt 266.0 lb

## 2016-06-09 DIAGNOSIS — Z30018 Encounter for initial prescription of other contraceptives: Secondary | ICD-10-CM

## 2016-06-09 DIAGNOSIS — F4321 Adjustment disorder with depressed mood: Secondary | ICD-10-CM

## 2016-06-09 MED ORDER — ETONOGESTREL-ETHINYL ESTRADIOL 0.12-0.015 MG/24HR VA RING: VAGINAL_RING | VAGINAL | 12 refills | Status: DC

## 2016-06-09 NOTE — Progress Notes (Signed)
Subjective:Preterm infant died day one     Amanda SewerShawnee [name] is a 25 y.o. female who presents for a postpartum visit. She is 7 weeks postpartum following a low cervical transverse Cesarean section. I have fully reviewed the prenatal and intrapartum course. The delivery was at 25.3 gestational weeks. The infant died shortly after deliveryOutcome: primary cesarean section, low transverse incision. Anesthesia: spinal. Postpartum course has been unremarkable. Bleeding no bleeding. Bowel function is normal. Bladder function is normal. Patient is sexually active. Contraception method is NuvaRing vaginal inserts. Depression/Anxiety screening: positive.  The following portions of the patient's history were reviewed and updated as appropriate: allergies, current medications, past family history, past medical history, past social history, past surgical history and problem list.  Review of Systems Pertinent items are noted in HPI.   Objective:    BP (!) 135/94   Pulse (!) 104   Ht 5\' 2"  (1.575 m)   Wt 266 lb (120.7 kg)   LMP 05/21/2016 (Within Days)   Breastfeeding? No   BMI 48.65 kg/m   General:  alert, cooperative and tearful     Lungs: nl effort  Heart:     Abdomen: soft, non-tender; bowel sounds normal; no masses,  no organomegaly and incision ok   Vulva:  not evaluated  Vagina: not evaluated                    Assessment:     7 week postpartum exam, neonatal death Pap smear not done at today's visit.   Plan:    1. Contraception: NuvaRing vaginal inserts 2. Amanda [name] to see 3. Follow as needed.    Amanda PhenixJames G Deema Juncaj, MD 06/09/2016

## 2016-06-09 NOTE — BH Specialist Note (Signed)
  ASSESSMENT: Pt currently experiencing Grief.  Pt would benefit from supportive counseling regarding grief; may benefit from group grief support in the future. Stage of Change: precontemplative  PLAN: 1. F/U with behavioral health clinician as needed 2. Psychiatric Medications: none 3. Behavioral recommendations:   -Consider Heartstrings services, as needed  SUBJECTIVE: Pt. referred by Scheryl DarterJames Arnold, MD, for grief Pt. reports the following symptoms/concerns: Pt is grieving loss, and adjusting to change; pt has supportive family to cope. Duration of problem: three weeks Severity: moderate   OBJECTIVE: Orientation & Cognition: Oriented x3. Thought processes normal and appropriate to situation. Mood: grieving, teary Affect: appropriate Appearance: appropriate Risk of harm to self or others: no known risk of harm to self or others Substance use: none Assessments administered: PHQ9: 14/ GAD7: 14  Diagnosis: Grief CPT Code: F43.21  -------------------------------------------- Other(s) present in the room: none  Time spent with patient in exam room: 15 minutes, 3:45-4:00pm Visit number: 1   [facility] 06/09/2016 03/25/2016  Decreased Interest 2 0  Down, Depressed, Hopeless 2 0  PHQ - 2 Score 4 0  Altered sleeping 2 0  Tired, decreased energy 2 0  Change in appetite 2 0  Feeling bad or failure about yourself  2 0  Trouble concentrating 2 0  Moving slowly or fidgety/restless 0 0  Suicidal thoughts 0 0  PHQ-9 Score 14 0   GAD 7 : Generalized Anxiety Score 06/09/2016 03/25/2016  Nervous, Anxious, on Edge 2 0  Control/stop worrying 2 0  Worry too much - different things 2 0  Trouble relaxing 2 0  Restless 2 0  Easily annoyed or irritable 2 0  Afraid - awful might happen 2 0  Total GAD 7 Score 14 0

## 2016-08-14 ENCOUNTER — Other Ambulatory Visit: Payer: Self-pay | Admitting: Obstetrics & Gynecology

## 2016-08-19 ENCOUNTER — Telehealth: Payer: Self-pay | Admitting: *Deleted

## 2016-08-19 NOTE — Telephone Encounter (Signed)
Pt left message yesterday stating that she needs some changes made to her STD forms which were previously completed. The forms state that her first approved Carri Spillers out of work was 7/26 however she was seen @ [facility] on 6/8 and could not work that [place], then admitted to hospital on 6/9. She was discharged on 02/29/16. After that, she states that she was on bedrest for the remainder of her pregnancy. Please call back to discuss these changes.

## 2016-08-23 NOTE — Telephone Encounter (Signed)
Patient called and left message stating she is calling about a correction needing to be made to her short term disability. Patient states she was on bedrest starting 6/8 and was out of work until 8/16. Patient states she needs these dates reflected on there and the physician to sign the form as well. Patient states she needs this taken care of by the first of the year. Papers corrected & faxed. Called patient, no answer- left message stating we have received your message & was trying to call you back. We have corrected your papers & sent those in. You may call us back if you have questions

## 2016-10-10 ENCOUNTER — Encounter: Payer: Self-pay | Admitting: Obstetrics & Gynecology

## 2016-11-12 ENCOUNTER — Encounter: Payer: Self-pay | Admitting: Obstetrics & Gynecology

## 2017-06-01 IMAGING — US US MFM OB LIMITED
1 series · 15 of 24 positions shown · non-contrast
Comparison: none

[Series 1: us mfm ob limited · 15 of 24 slices shown]
[im 1/24]
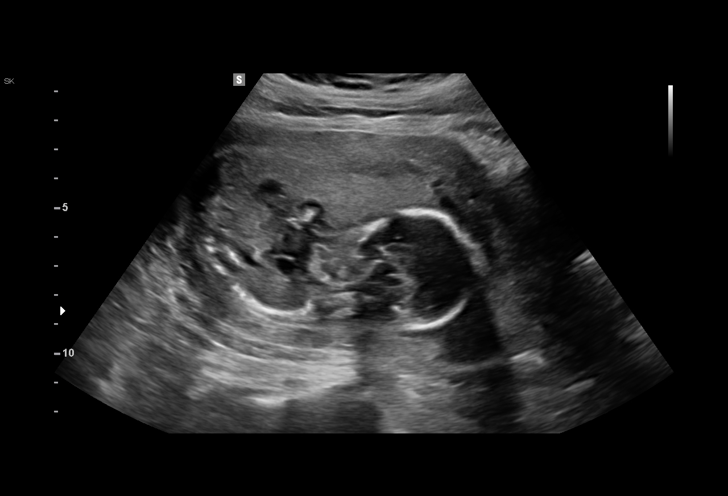
[im 3/24]
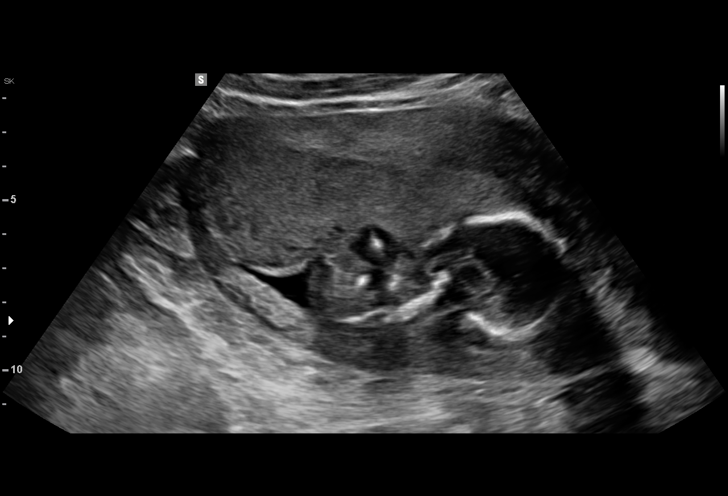
[im 5/24]
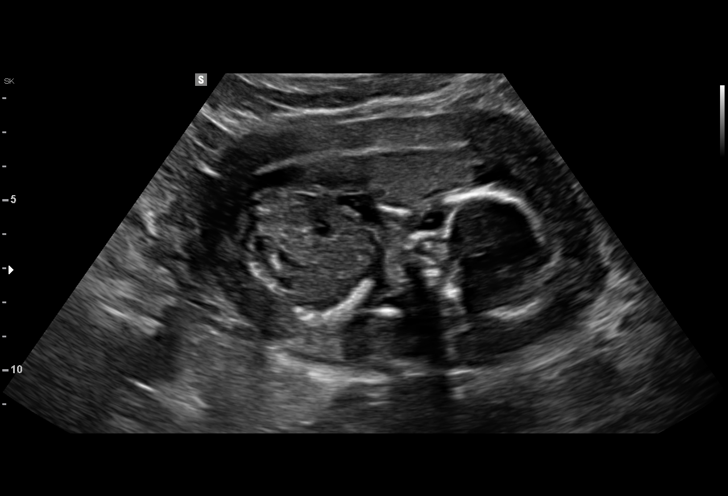
[im 6/24]
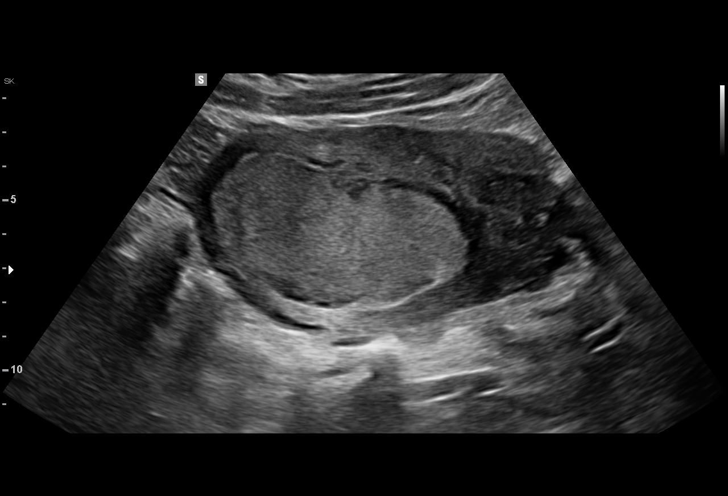
[im 8/24]
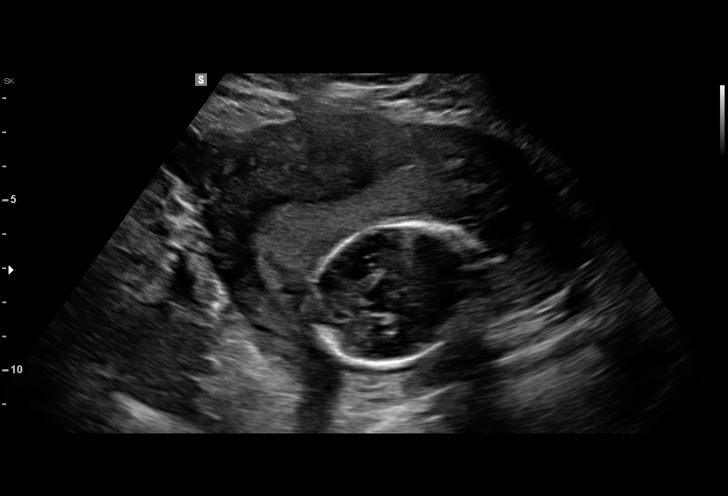
[im 9/24]
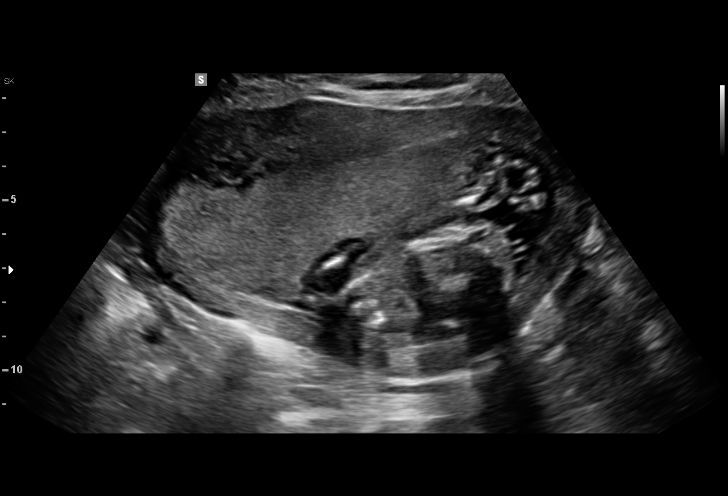
[im 11/24]
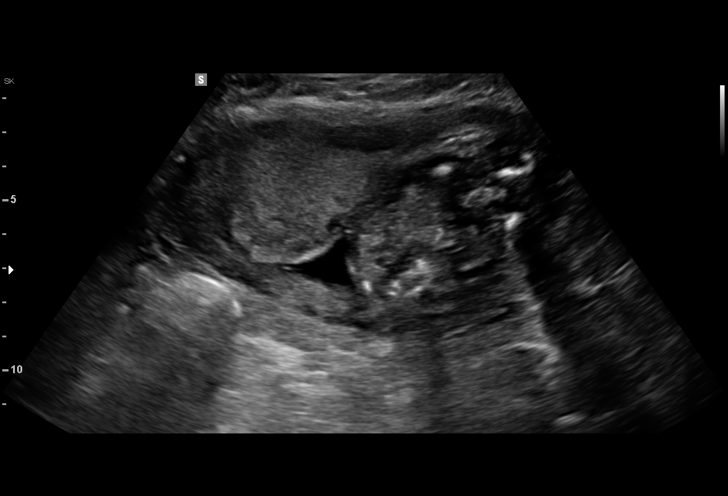
[im 13/24]
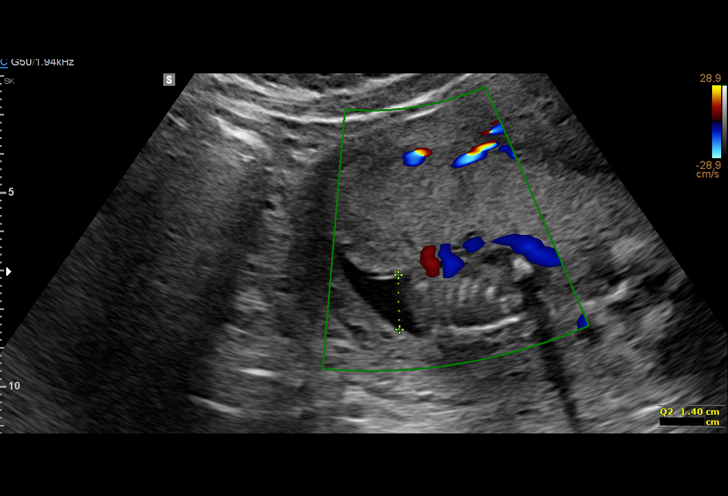
[im 14/24]
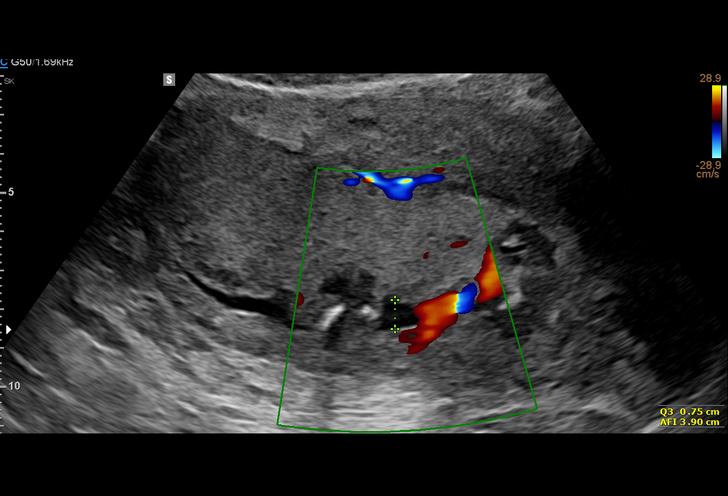
[im 16/24]
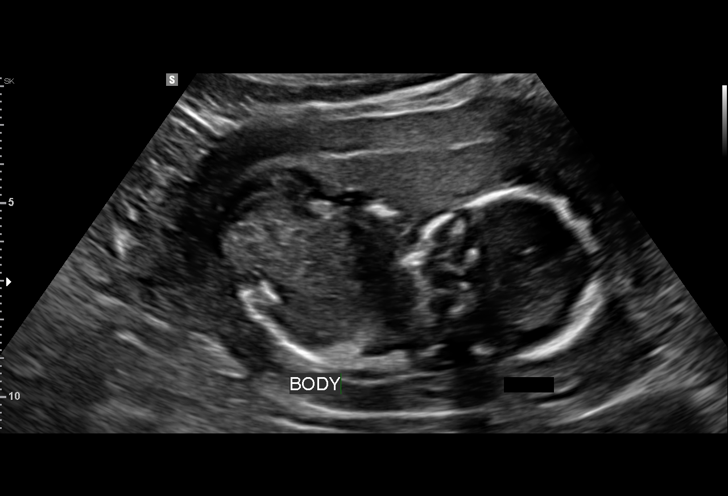
[im 17/24]
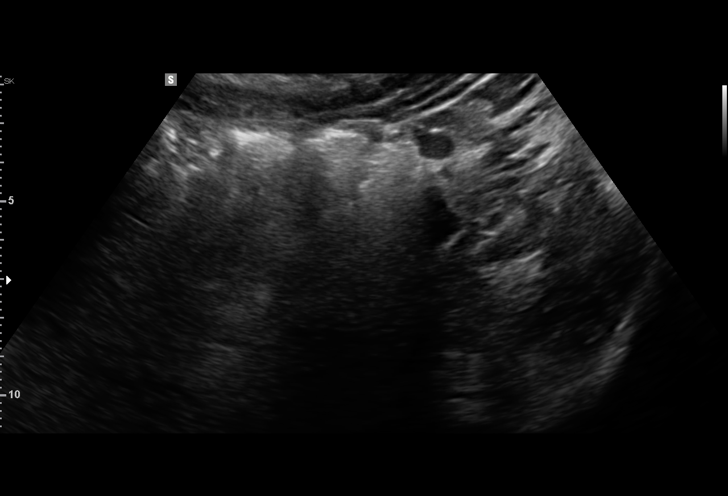
[im 19/24]
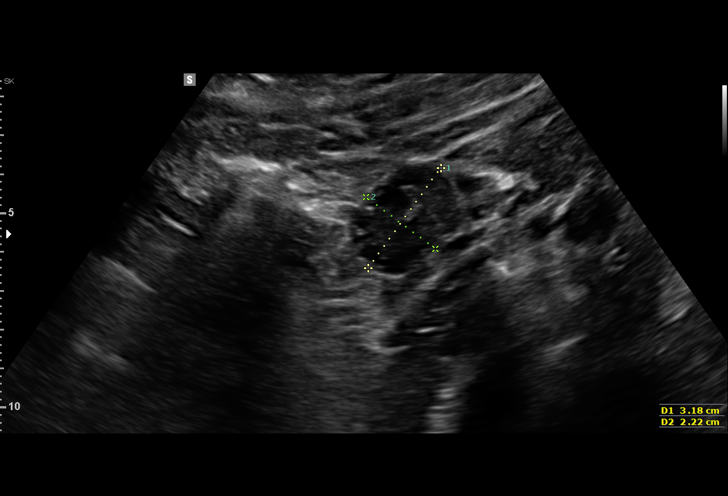
[im 21/24]
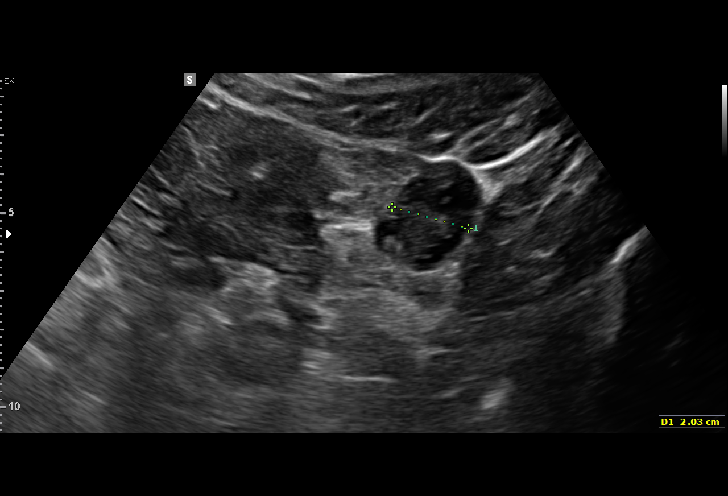
[im 22/24]
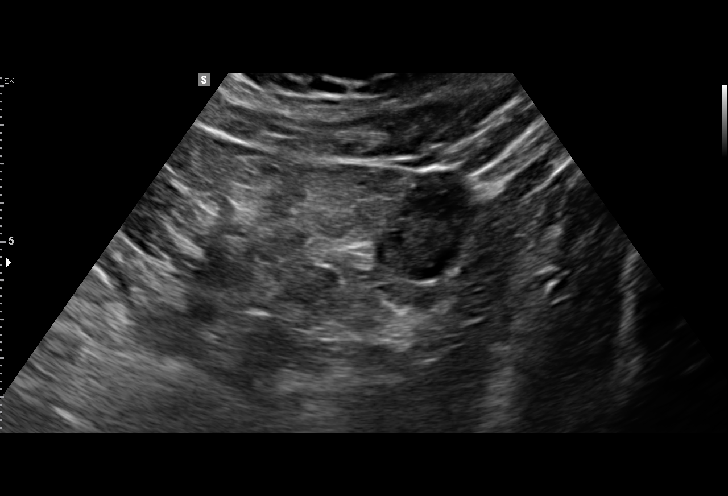
[im 24/24]
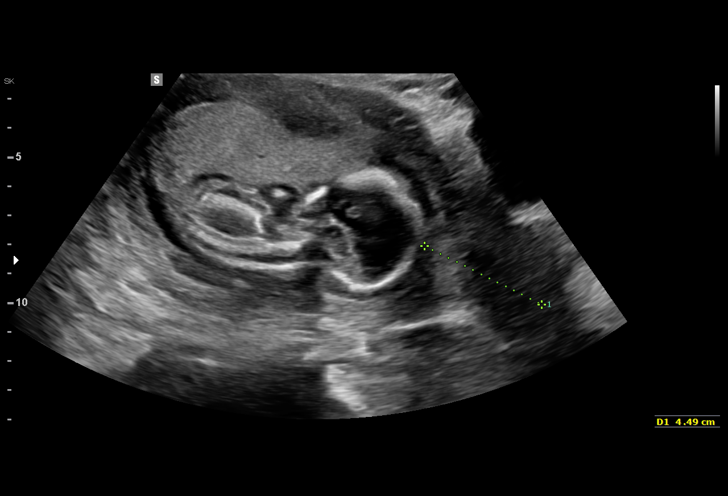

[15 of 24 positions shown; findings below may reference images not displayed]

Indications

19 weeks gestation of pregnancy
Premature rupture of membranes - leaking
fluid
Obesity complicating pregnancy, second
trimester
OB History

Gravidity:    3         Term:   0         SAB:   2
Fetal Evaluation

Num Of Fetuses:     1
Fetal Heart         168
Rate(bpm):
Cardiac Activity:   Observed
Presentation:       Cephalic
Placenta:           Anterior, above cervical os

Amniotic Fluid
AFI FV:      Oligohydramnios

AFI Sum(cm)     %Tile       Largest Pocket(cm)
3.9             < 3

RUQ(cm)                     LUQ(cm)        LLQ(cm)
1.75
Gestational Age

LMP:           19w 4d       Date:   10/12/15                 EDD:   07/18/16
Best:          19w 4d    Det. By:   LMP  (10/12/15)          EDD:   07/18/16
Cervix Uterus Adnexa

Cervix
Length:              4  cm.
Normal appearance by transabdominal scan.

Left Ovary
Within normal limits.

Right Ovary
Not visualized.
Impression

Single IUP at 19w 4d
Suspected PROM
Limited ultrasound performed
Cephalic presentation
Oligohydramnios is noted - AFI of 3.9 cm with single deepest
pocket of 1.75 cm
Recommendations

Follow-up ultrasounds as clinically indicated.
Would offer admission at viability (
 23 weeks) for course of
Roberto and latency antibiotics

## 2017-06-02 IMAGING — US US MFM OB DETAIL+14 WK
2 series · 12 of 28 positions shown · non-contrast
Comparison: none

1  ALEXANDE JEFERSON            [id number]      [id number]     [id number]
Indications

18 weeks gestation of pregnancy
Detailed fetal anatomic survey                 [id number]
Abnormal fetal ultrasound (cleft lip)
Premature rupture of membranes - leaking
fluid
Obesity complicating pregnancy, second
trimester
OB History
Blood Type:            Height:  5'1"   Weight (lb):  260       BMI:
Gravidity:    3         Term:   0        Prem:   0        SAB:   2
TOP:          0       Ectopic:  0        Living: 0
Fetal Evaluation
Num Of Fetuses:     1
Fetal Heart         145
Rate(bpm):
Cardiac Activity:   Observed
Presentation:       Variable
Placenta:           Anterior, above cervical os
P. Cord Insertion:  Not well visualized
Amniotic Fluid
AFI FV:      Oligohydramnios
Largest Pocket(cm)
1.6
Comment:    Small probable subchorionic hemorrhage noted at LUS 2.9 x
x 4.3 cm.
Biometry
BPD:      41.8  mm     G. Age:  18w 4d         48  %    CI:         75.2   %    70 - 86
FL/HC:      16.7   %    16.1 -
HC:      152.9  mm     G. Age:  18w 2d         23  %    HC/AC:      1.09        1.09 -
AC:      139.8  mm     G. Age:  19w 3d         68  %    FL/BPD:     61.2   %
FL:       25.6  mm     G. Age:  17w 5d         15  %    FL/AC:      18.3   %    20 - 24
HUM:      25.1  mm     G. Age:  17w 6d         27  %
CER:      19.3  mm     G. Age:  18w 5d         48  %
NFT:       4.2  mm
CM:        3.1  mm
Est. FW:     247  gm      0 lb 9 oz     43  %
Gestational Age
LMP:           19w 5d        Date:  10/12/15                 EDD:   07/18/16
U/S Today:     18w 4d                                        EDD:   07/26/16
Best:          18w 5d     Det. By:  Early Ultrasound         EDD:   07/25/16
(12/04/15)
Anatomy
Cranium:               Appears normal         LVOT:                   Appears normal
Cavum:                 Not well visualized    Aortic Arch:            Not well visualized
Ventricles:            Appears normal         Ductal Arch:            Not well visualized
Choroid Plexus:        Appears normal         Diaphragm:              Appears normal
Cerebellum:            Appears normal         Stomach:                Appears normal, left
sided
Posterior Fossa:       Appears normal         Abdomen:                Appears normal
Nuchal Fold:           Appears normal         Abdominal Wall:         Not well visualized
Face:                  Orbits nl; profile not Cord Vessels:           Appears normal (3
well visualized                                vessel cord)
Lips:                  Not well visualized    Kidneys:                Appear normal
Palate:                Not well visualized    Bladder:                Appears normal
Thoracic:              Appears normal         Spine:                  Limited views
appear normal
Heart:                 Not well visualized    Upper Extremities:      Visualized
RVOT:                  Appears normal         Lower Extremities:      Visualized
Other:  Gender not well visualized. Technically difficult due to low amniotic
fluid. Technically difficult due to maternal habitus and fetal position.
Cervix Uterus Adnexa
Cervix
Length:            4.7  cm.
Normal appearance by transabdominal scan.
Uterus
No abnormality visualized.
Left Ovary
Size(cm)     2.99   x   3.03   x  2.23      Vol(ml):
Within normal limits. No adnexal mass visualized.
Right Ovary
Size(cm)     3.13   x   2.35   x  1.83      Vol(ml): 7
Cul De Sac:   No free fluid seen.
Adnexa:       No abnormality visualized.
Impression
INDICATION: 24 yr old [id number] at 01w2d with previable
PPROM, possible uterine septum, subchorionic hemorrhage,
and finding of cleft lip on outside ultrasound for fetal anatomic
survey.

[Series 1: us mfm ob detail+14 wk · 108 acquisitions, 11 frames shown (1 of 2)]
[im 5/108]
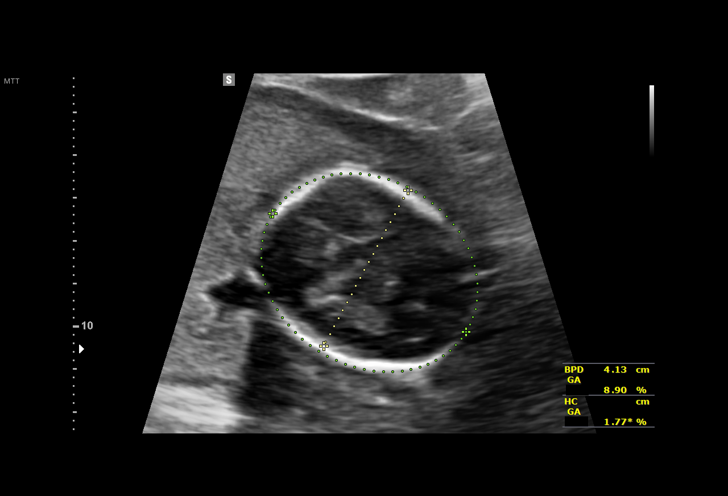
[im 14/108]
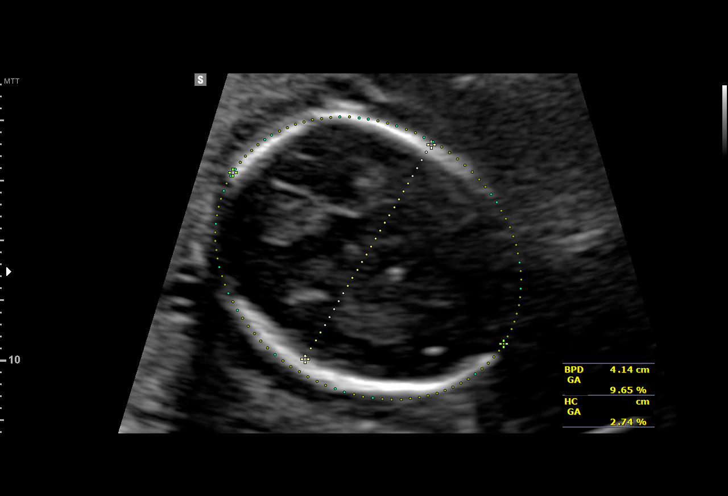
[im 23/108]
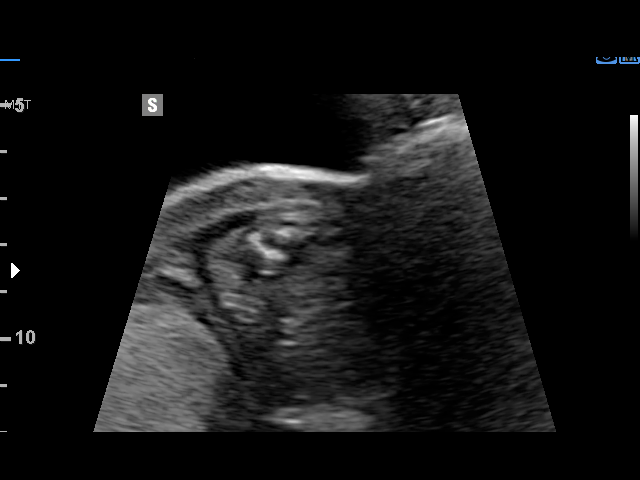
[im 36/108]
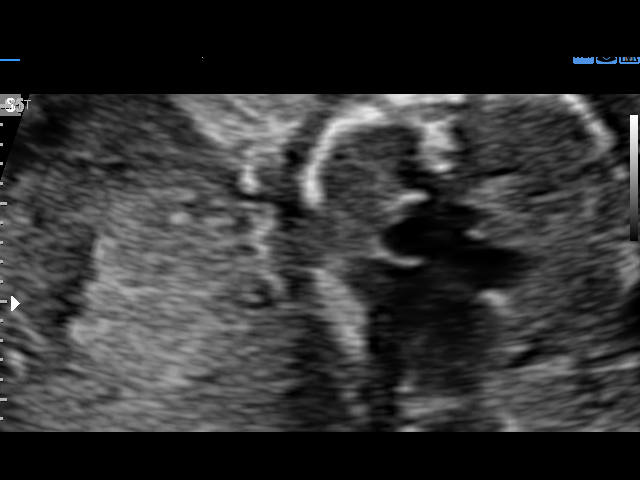
[im 45/108]
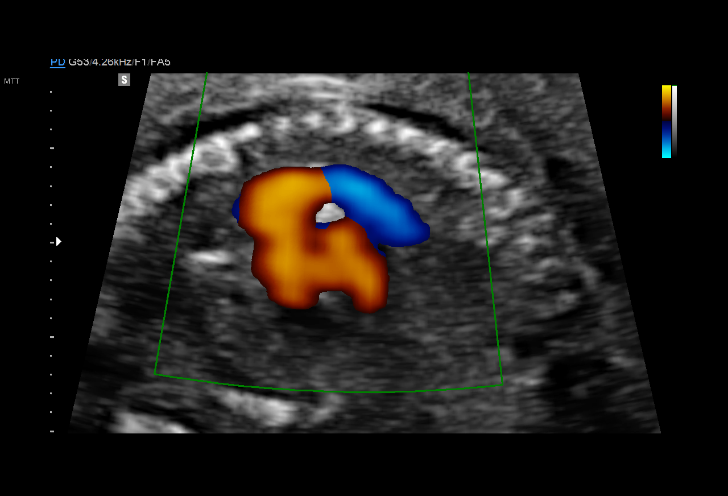
[im 54/108]
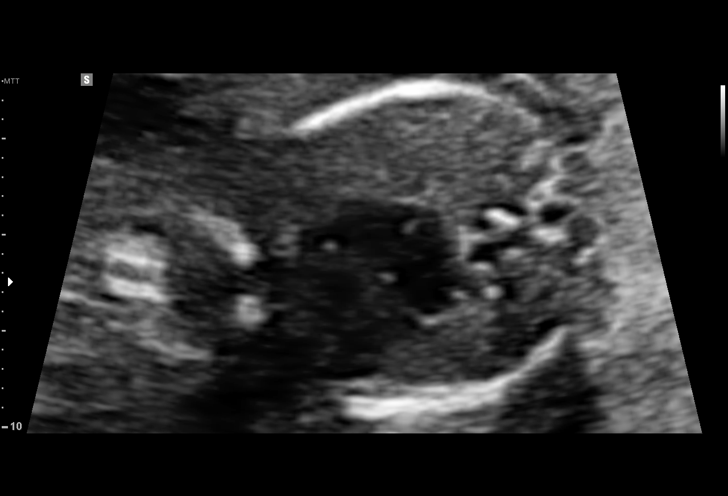
[im 67/108]
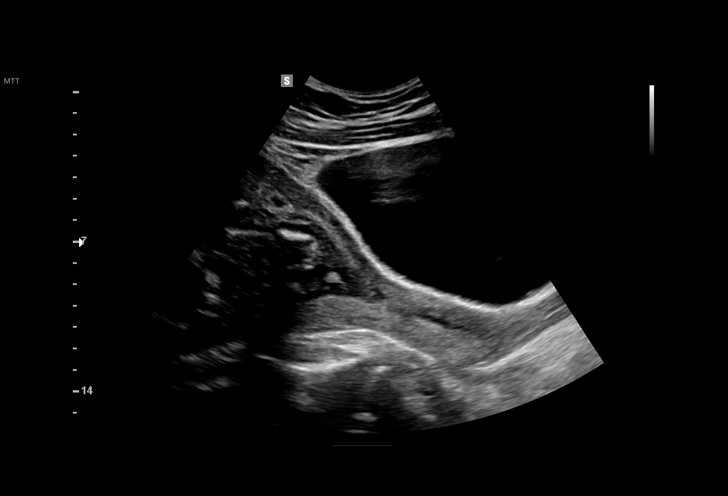
[im 76/108]
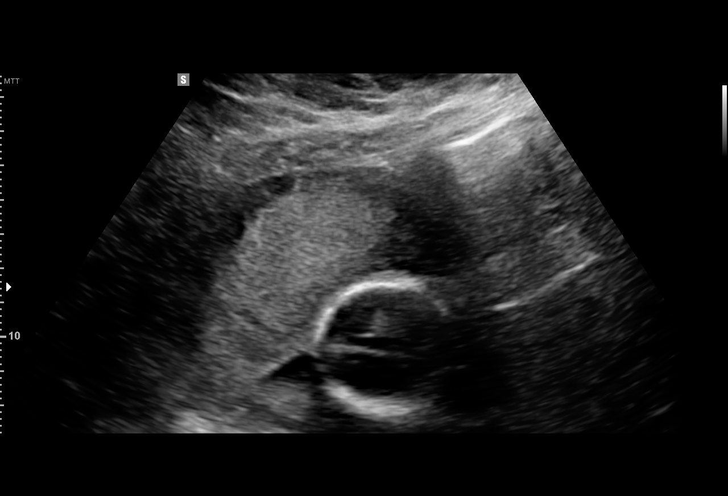
[im 85/108]
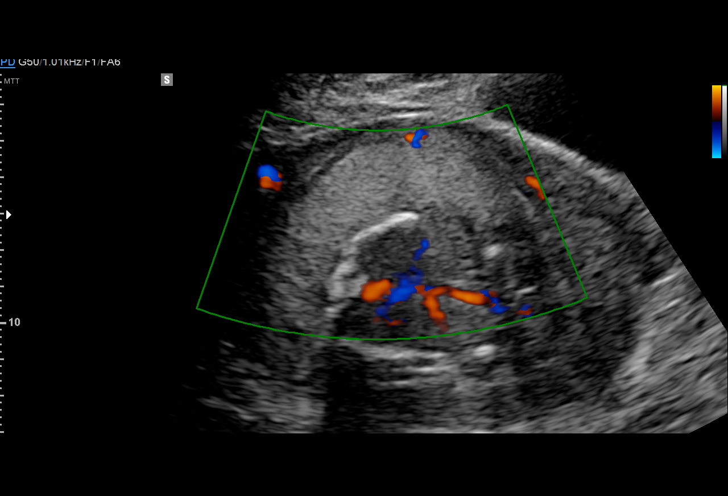
[im 99/108]
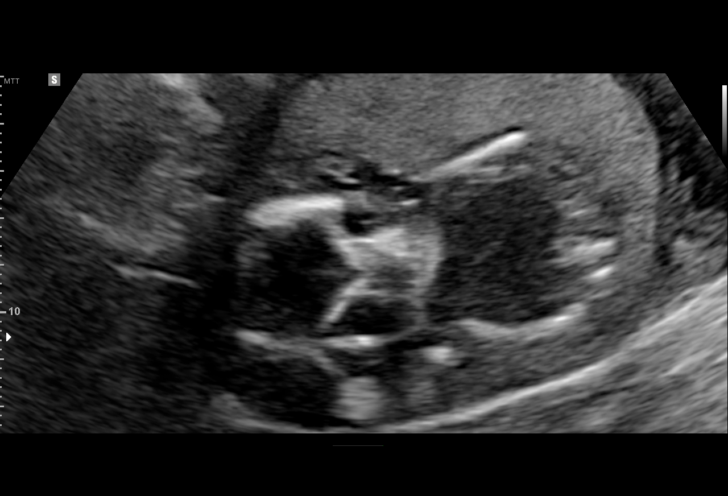
[im 108/108]
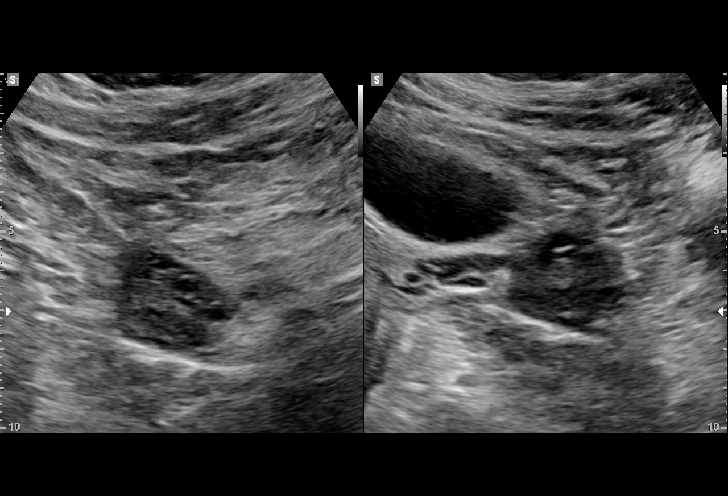

[Series 3: us mfm ob detail+14 wk · 1 of 15 slices shown (2 of 2)]
[im 8/15]
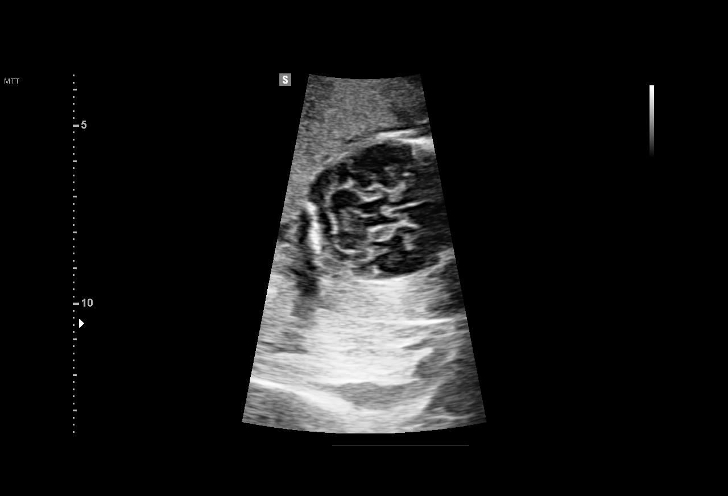

[12 of 28 positions shown; findings below may reference images not displayed]

FINDINGS: 1. Single intrauterine pregnancy.
2. Fetal biometry is consistent with dating.
3. Anterior placenta without evidence of previa. There is a
subchorionic hemorrahge seen in the lower uterine segment
measuring 0x9cm.
4. Oligohydramnios with maximum vertical pocket of 1.6cm.
5. Normal transabdominal cervical length.
6. The views of the cavum, palate, profile/nasal bone, lips,
spine, heart, abdominal cord insertion, ankles, and hands are
limited.
7. On limited views of the lips there appears to be a cleft; the
palate is not well visualized.
8. The remainder of the limited anatomy survey is normal.
Recommendations

1. Appropriate fetal growth.
2. Limited anatomy survey:
- recommend follow up in 3 weeks for fetal growth and
reevaluate fetal anatomy
- discussed limitations of ulttrasound in detecting fetal
anomalies especially in the setting of oligohydramnios
3. PPROM:
- see consult letter
- patient has chosen expectant management
- being admitted for 48 hours of IV antibiotics; will return for
prolonged hospitalization at viability for Albertha and
repeat antibiotics
- serial fetal growth
- recommend delivery at 34 weeks or sooner for labor,
infection, or nonreassuring fetal status
- recommend bleeding, infection, and preterm labor
precautions
4. Possible uterine septum:
- cannot be seen on today's ultrasound
- recommend serial growth as above
- recommend reevaluate postpartum
5. Subchorionic hemorrhage:
- recommend bleeding precautions
6. Likely cleft lip:
- discussed possible association with other anomalies- none
seen on today's limited ultrasound; discussed if found with
other anomalies the increased risk of genetic conditions
- when found in isolation that risk is not significantly increased
- will refer to Pediatric plastic surgery
- discussed neonate will need repair and aid in feeding
- discussed possibility of cleft palate- palate not well seen on
today's exam
4. Don Lolito Onacram:
I discussed the association with fetal aneuploidy- although
risk for aneuploidy was low on first trimester screen. I also
discussed the association with increased risk of adverse
pregnancy outcomes including increased risk of fetal growth
restriction, preeclampsia, stillbirth, preterm birth, and
abruption. I discussed these risks are only slightly increased
over baseline but heightened surveillance is warranted. I
recommend serial fetal growth ultrasounds. I recommend
close surveillance for the development of signs/symptoms of
preeclampsia.
5. Borderline MSAFP of [id number]:
- likely related to cleft lip and/or subchorionic hemorrhage

## 2018-03-08 ENCOUNTER — Other Ambulatory Visit: Payer: Self-pay | Admitting: *Deleted

## 2018-03-08 DIAGNOSIS — Z30018 Encounter for initial prescription of other contraceptives: Secondary | ICD-10-CM

## 2018-03-08 MED ORDER — ETONOGESTREL-ETHINYL ESTRADIOL 0.12-0.015 MG/24HR VA RING
VAGINAL_RING | VAGINAL | 1 refills | Status: DC
Start: 2018-03-08 — End: 2023-05-30

## 2018-03-08 NOTE — Progress Notes (Signed)
Received a fax for refill on nuvaring. Patient not seen since 2017, no pap on file. Per discussion with Dr.Arnold will renew x 2 months, but she will need to get annual exam for further refills.

## 2021-03-11 ENCOUNTER — Other Ambulatory Visit: Payer: Self-pay | Admitting: Obstetrics and Gynecology

## 2021-03-11 DIAGNOSIS — N644 Mastodynia: Secondary | ICD-10-CM

## 2021-04-23 ENCOUNTER — Other Ambulatory Visit: Payer: Self-pay

## 2021-05-21 ENCOUNTER — Other Ambulatory Visit: Payer: Self-pay

## 2023-05-30 ENCOUNTER — Inpatient Hospital Stay: Payer: 59 | Attending: Oncology | Admitting: Oncology

## 2023-05-30 ENCOUNTER — Encounter: Payer: Self-pay | Admitting: Oncology

## 2023-05-30 ENCOUNTER — Inpatient Hospital Stay: Payer: 59

## 2023-05-30 VITALS — BP 120/75 | HR 67 | Temp 97.6°F | Resp 18 | Ht 63.0 in | Wt 146.4 lb

## 2023-05-30 DIAGNOSIS — D539 Nutritional anemia, unspecified: Secondary | ICD-10-CM | POA: Insufficient documentation

## 2023-05-30 DIAGNOSIS — K909 Intestinal malabsorption, unspecified: Secondary | ICD-10-CM

## 2023-05-30 DIAGNOSIS — D509 Iron deficiency anemia, unspecified: Secondary | ICD-10-CM | POA: Diagnosis not present

## 2023-05-30 DIAGNOSIS — E610 Copper deficiency: Secondary | ICD-10-CM | POA: Diagnosis not present

## 2023-05-30 DIAGNOSIS — K912 Postsurgical malabsorption, not elsewhere classified: Secondary | ICD-10-CM | POA: Insufficient documentation

## 2023-05-30 DIAGNOSIS — Z9884 Bariatric surgery status: Secondary | ICD-10-CM | POA: Insufficient documentation

## 2023-05-30 LAB — CBC WITH DIFFERENTIAL/PLATELET
Abs Immature Granulocytes: 0.01 10*3/uL (ref 0.00–0.07)
Basophils Absolute: 0 10*3/uL (ref 0.0–0.1)
Basophils Relative: 1 %
Eosinophils Absolute: 0.1 10*3/uL (ref 0.0–0.5)
Eosinophils Relative: 1 %
HCT: 30.7 % — ABNORMAL LOW (ref 36.0–46.0)
Hemoglobin: 8.6 g/dL — ABNORMAL LOW (ref 12.0–15.0)
Immature Granulocytes: 0 %
Lymphocytes Relative: 40 %
Lymphs Abs: 1.5 10*3/uL (ref 0.7–4.0)
MCH: 19.4 pg — ABNORMAL LOW (ref 26.0–34.0)
MCHC: 28 g/dL — ABNORMAL LOW (ref 30.0–36.0)
MCV: 69.1 fL — ABNORMAL LOW (ref 80.0–100.0)
Monocytes Absolute: 0.4 10*3/uL (ref 0.1–1.0)
Monocytes Relative: 11 %
Neutro Abs: 1.7 10*3/uL (ref 1.7–7.7)
Neutrophils Relative %: 47 %
Platelets: 346 10*3/uL (ref 150–400)
RBC: 4.44 MIL/uL (ref 3.87–5.11)
RDW: 19.1 % — ABNORMAL HIGH (ref 11.5–15.5)
WBC: 3.7 10*3/uL — ABNORMAL LOW (ref 4.0–10.5)
nRBC: 0 % (ref 0.0–0.2)

## 2023-05-30 LAB — VITAMIN B12: Vitamin B-12: 309 pg/mL (ref 180–914)

## 2023-05-30 LAB — DIRECT ANTIGLOBULIN TEST (NOT AT ARMC)
DAT, IgG: NEGATIVE
DAT, complement: NEGATIVE

## 2023-05-30 LAB — VITAMIN D 25 HYDROXY (VIT D DEFICIENCY, FRACTURES): Vit D, 25-Hydroxy: 52.28 ng/mL (ref 30–100)

## 2023-05-30 LAB — FERRITIN: Ferritin: 2 ng/mL — ABNORMAL LOW (ref 11–307)

## 2023-05-30 LAB — FOLATE: Folate: 3.8 ng/mL — ABNORMAL LOW (ref >5.9)

## 2023-05-30 NOTE — Progress Notes (Signed)
[facility] Initial Visit:  Patient Care Team: Julianne Handler, NP as PCP - General (Nurse Practitioner)  CHIEF COMPLAINTS/PURPOSE OF CONSULTATION:  HISTORY OF PRESENTING ILLNESS: Amanda [name] 32 y.o. female is here because of  anemia Medical history notable for eating disorder, polycystic ovary syndrome, gastric bypass, diabetes mellitus type 2  May 16, 2023: WBC 3.6 hemoglobin 9.0 MCV 63 platelet count 341; 54 segs 39 lymphs 7 meds CMP normal  May 30 2023:  [facility] Hematology Consult Patient underwent gastric bypass in September 2020 with loss of 100 lbs.  Patient is G2 P2001.  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  Has never received IV iron nor required PRBC's in the past.  Has tolerated oral iron from a GI standpoint.  Has a normal diet.  No history of postoperative or postpartum hemorrhage requiring transfusion.  No hematochezia, melena, hemoptysis, hematuria.  No history of intra-articular or soft tissue bleeding.  No history of abnormal bleeding in family members Patient has symptoms of fatigue, pallor,  cold intolerance, DOE, decreased performance status.  Patient has pica to ice but not starch/dirt.    Review of Systems - Oncology  MEDICAL HISTORY: Past Medical History:  Diagnosis Date   Celiac disease    Polycystic ovarian syndrome     SURGICAL HISTORY: Past Surgical History:  Procedure Laterality Date   APPENDECTOMY     CESAREAN SECTION N/A 04/15/2016   Procedure: CESAREAN SECTION;  Surgeon: Tilda Burrow, MD;  Location: [facility] BIRTHING SUITES;  Service: Obstetrics;  Laterality: N/A;   foot surgery      GASTRIC BYPASS     TONSILLECTOMY      SOCIAL HISTORY: Social History   Socioeconomic History   Marital status: Married    Spouse name: Reita Cliche   Number of children: 1   Years of education: 12+2   Highest education level: Associate degree: academic program   Occupational History   Occupation: Social worker  Tobacco Use   Smoking status: Never   Smokeless tobacco: Never  Vaping Use   Vaping status: Never Used  Substance and Sexual Activity   Alcohol use: Never   Drug use: Never   Sexual activity: Not Currently  Other Topics Concern   Not on file  Social History Narrative   Not on file   Social Determinants of Health   Financial Resource Strain: Not on file  Food Insecurity: No Food Insecurity (05/30/2023)   Hunger Vital Sign    Worried About Running Out of Food in the Last Year: Never true    Ran Out of Food in the Last Year: Never true  Transportation Needs: No Transportation Needs (05/30/2023)   PRAPARE - Administrator, Civil Service (Medical): No    Lack of Transportation (Non-Medical): No  Physical Activity: Not on file  Stress: Not on file  Social Connections: Not on file  Intimate Partner Violence: Not At Risk (05/30/2023)   Humiliation, Afraid, Rape, and Kick questionnaire    Fear of Current or Ex-Partner: No    Emotionally Abused: No    Physically Abused: No    Sexually Abused: No    FAMILY HISTORY History reviewed. No pertinent family history.  ALLERGIES:  is allergic to nsaids and gluten meal.  MEDICATIONS:  Current Outpatient Medications  Medication Sig Dispense Refill   buPROPion (WELLBUTRIN XL) 150 MG 24 hr tablet Take 150 mg  by mouth every evening.     buPROPion (WELLBUTRIN XL) 300 MG 24 hr tablet Take 300 mg by mouth every morning.     calcium citrate (CALCITRATE - DOSED IN MG ELEMENTAL CALCIUM) 950 (200 Ca) MG tablet Take 200 mg of elemental calcium by mouth daily.     Multiple Vitamin (MULTIVITAMIN) tablet Take 1 tablet by mouth daily. Bariatric MVI     OZEMPIC, 2 MG/DOSE, 8 MG/3ML SOPN 2 mg once a week.     testosterone cypionate (DEPOTESTOSTERONE CYPIONATE) 200 MG/ML injection Inject into the skin.     metFORMIN (GLUCOPHAGE) 1000 MG tablet Take 1,000 mg by mouth 2 (two) times daily.     No  current facility-administered medications for this visit.    PHYSICAL EXAMINATION:  ECOG PERFORMANCE STATUS: 1 - Symptomatic but completely ambulatory   Vitals:   05/30/23 0853  BP: 120/75  Pulse: 67  Resp: 18  Temp: 97.6 F (36.4 C)  SpO2: 100%    Filed Weights   05/30/23 0853  Weight: 146 lb 6.4 oz (66.4 kg)     Physical Exam Vitals and nursing note reviewed.  Constitutional:      General: She is not in acute distress.    Appearance: Normal appearance. She is normal weight. She is not ill-appearing, toxic-appearing or diaphoretic.  HENT:     Head: Normocephalic and atraumatic.     Right Ear: External ear normal.     Left Ear: External ear normal.     Nose: Nose normal. No congestion or rhinorrhea.  Eyes:     General: No scleral icterus.    Extraocular Movements: Extraocular movements intact.     Conjunctiva/sclera: Conjunctivae normal.     Pupils: Pupils are equal, round, and reactive to light.  Cardiovascular:     Rate and Rhythm: Normal rate and regular rhythm.     Heart sounds: No murmur heard.    No friction rub. No gallop.  Pulmonary:     Effort: Pulmonary effort is normal. No respiratory distress.     Breath sounds: Normal breath sounds. No wheezing or rales.  Abdominal:     General: Bowel sounds are normal.     Palpations: Abdomen is soft.     Tenderness: There is no abdominal tenderness. There is no guarding or rebound.  Musculoskeletal:        General: No swelling, tenderness or deformity.     Cervical back: Normal range of motion and neck supple. No rigidity or tenderness.     Right lower leg: No edema.  Lymphadenopathy:     Head:     Right side of head: No submental, submandibular, tonsillar, preauricular, posterior auricular or occipital adenopathy.     Left side of head: No submental, submandibular, tonsillar, preauricular, posterior auricular or occipital adenopathy.     Cervical: No cervical adenopathy.     Right cervical: No superficial,  deep or posterior cervical adenopathy.    Left cervical: No superficial, deep or posterior cervical adenopathy.     Upper Body:     Right upper body: No supraclavicular, axillary, pectoral or epitrochlear adenopathy.     Left upper body: No supraclavicular, axillary, pectoral or epitrochlear adenopathy.  Skin:    General: Skin is warm and dry.     Coloration: Skin is not jaundiced.  Neurological:     General: No focal deficit present.     Mental Status: She is alert and oriented to person, place, and time.     Cranial Nerves:  No cranial nerve deficit.  Psychiatric:        Behavior: Behavior normal.        Thought Content: Thought content normal.        Judgment: Judgment normal.     Comments: Affect depressed      LABORATORY DATA: I have personally reviewed the data as listed:  No visits with results within 1 Month(s) from this visit.  Latest known visit with results is:  Admission on 03/28/2016, Discharged on 04/16/2016  Component Date Value Ref Range Status   Urine Total Volume-UPROT 03/28/2016 2,850  mL Final   Collection Interval-UPROT 03/28/2016 24  hours Final   Protein, Urine 03/28/2016 <6  mg/dL Final   REPEATED TO VERIFY   Protein, 24H Urine 03/28/2016         50 - 100 mg/day Final   Comment: RESULT BELOW REPORTABLE RANGE, UNABLE TO CALCULATE. Performed at [facility]    Specimen Description 03/28/2016 VAGINAL/RECTAL   Final   Special Requests 03/28/2016 NONE   Final   Culture 03/28/2016  (A)   Final                   Value:GROUP B STREP(S.AGALACTIAE)ISOLATED CRITICAL RESULT CALLED TO, READ BACK BY AND VERIFIED WITH: Doreatha Massed, RN AT 1355 ON 03/30/16 BY Jamse Arn, [place]. Performed at [facility]    Report Status 03/28/2016 03/30/2016 FINAL   Final   ABO/RH(D) 03/30/2016 A POS   Final   Antibody Screen 03/30/2016 NEG   Final   Sample Expiration 03/30/2016 04/02/2016   Final   GBS 03/30/2016 Positive   Final   ABO/RH(D) 04/02/2016 A POS   Final    Antibody Screen 04/02/2016 NEG   Final   Sample Expiration 04/02/2016 04/05/2016   Final   WBC 04/05/2016 12.5 (H)  4.0 - 10.5 K/uL Final   RBC 04/05/2016 4.33  3.87 - 5.11 MIL/uL Final   Hemoglobin 04/05/2016 13.2  12.0 - 15.0 g/dL Final   HCT 40/98/1191 39.3  36.0 - 46.0 % Final   MCV 04/05/2016 90.8  78.0 - 100.0 fL Final   MCH 04/05/2016 30.5  26.0 - 34.0 pg Final   MCHC 04/05/2016 33.6  30.0 - 36.0 g/dL Final   RDW 47/82/9562 14.0  11.5 - 15.5 % Final   Platelets 04/05/2016 227  150 - 400 K/uL Final   ABO/RH(D) 04/05/2016 A POS   Final   Antibody Screen 04/05/2016 NEG   Final   Sample Expiration 04/05/2016 04/08/2016   Final   WBC 04/08/2016 11.7 (H)  4.0 - 10.5 K/uL Final   RBC 04/08/2016 4.21  3.87 - 5.11 MIL/uL Final   Hemoglobin 04/08/2016 13.0  12.0 - 15.0 g/dL Final   HCT 13/04/6577 38.1  36.0 - 46.0 % Final   MCV 04/08/2016 90.5  78.0 - 100.0 fL Final   MCH 04/08/2016 30.9  26.0 - 34.0 pg Final   MCHC 04/08/2016 34.1  30.0 - 36.0 g/dL Final   RDW 46/96/2952 14.2  11.5 - 15.5 % Final   Platelets 04/08/2016 228  150 - 400 K/uL Final   ABO/RH(D) 04/08/2016 A POS   Final   Antibody Screen 04/08/2016 NEG   Final   Sample Expiration 04/08/2016 04/11/2016   Final   WBC 04/11/2016 10.5  4.0 - 10.5 K/uL Final   RBC 04/11/2016 4.17  3.87 - 5.11 MIL/uL Final   Hemoglobin 04/11/2016 12.9  12.0 - 15.0 g/dL Final   HCT 84/13/2440 38.0  36.0 - 46.0 % Final   MCV 04/11/2016 91.1  78.0 - 100.0 fL Final   MCH 04/11/2016 30.9  26.0 - 34.0 pg Final   MCHC 04/11/2016 33.9  30.0 - 36.0 g/dL Final   RDW 13/04/6577 14.2  11.5 - 15.5 % Final   Platelets 04/11/2016 235  150 - 400 K/uL Final   ABO/RH(D) 04/11/2016 A POS   Final   Antibody Screen 04/11/2016 NEG   Final   Sample Expiration 04/11/2016 04/14/2016   Final   WBC 04/14/2016 7.6  4.0 - 10.5 K/uL Final   RBC 04/14/2016 4.16  3.87 - 5.11 MIL/uL Final   Hemoglobin 04/14/2016 12.9  12.0 - 15.0 g/dL Final   HCT 46/96/2952 37.8  36.0 -  46.0 % Final   MCV 04/14/2016 90.9  78.0 - 100.0 fL Final   MCH 04/14/2016 31.0  26.0 - 34.0 pg Final   MCHC 04/14/2016 34.1  30.0 - 36.0 g/dL Final   RDW 84/13/2440 14.3  11.5 - 15.5 % Final   Platelets 04/14/2016 199  150 - 400 K/uL Final   ABO/RH(D) 04/14/2016 A POS   Final   Antibody Screen 04/14/2016 NEG   Final   Sample Expiration 04/14/2016 04/17/2016   Final   Unit Number 04/14/2016 [id number]   Final   Blood Component Type 04/14/2016 RED CELLS,LR   Final   Unit division 04/14/2016 00   Final   Status of Unit 04/14/2016 REL FROM [facility]   Final   Transfusion Status 04/14/2016 OK TO TRANSFUSE   Final   Crossmatch Result 04/14/2016 Compatible   Final   Unit Number 04/14/2016 [id number]   Final   Blood Component Type 04/14/2016 RED CELLS,LR   Final   Unit division 04/14/2016 00   Final   Status of Unit 04/14/2016 REL FROM [facility]   Final   Transfusion Status 04/14/2016 OK TO TRANSFUSE   Final   Crossmatch Result 04/14/2016 Compatible   Final   WBC 04/13/2016 9.0  4.0 - 10.5 K/uL Final   RBC 04/13/2016 4.08  3.87 - 5.11 MIL/uL Final   Hemoglobin 04/13/2016 12.7  12.0 - 15.0 g/dL Final   HCT 43/32/9518 37.0  36.0 - 46.0 % Final   MCV 04/13/2016 90.7  78.0 - 100.0 fL Final   MCH 04/13/2016 31.1  26.0 - 34.0 pg Final   MCHC 04/13/2016 34.3  30.0 - 36.0 g/dL Final   RDW 84/16/6063 14.3  11.5 - 15.5 % Final   Platelets 04/13/2016 203  150 - 400 K/uL Final   WBC 04/14/2016 6.3  4.0 - 10.5 K/uL Final   RBC 04/14/2016 4.07  3.87 - 5.11 MIL/uL Final   Hemoglobin 04/14/2016 12.7  12.0 - 15.0 g/dL Final   HCT 01/60/1093 37.4  36.0 - 46.0 % Final   MCV 04/14/2016 91.9  78.0 - 100.0 fL Final   MCH 04/14/2016 31.2  26.0 - 34.0 pg Final   MCHC 04/14/2016 34.0  30.0 - 36.0 g/dL Final   RDW 23/55/7322 14.5  11.5 - 15.5 % Final   Platelets 04/14/2016 153  150 - 400 K/uL Final   Neutrophils Relative % 04/14/2016 72  % Final   Neutro Abs 04/14/2016 4.5  1.7 - 7.7 K/uL Final   Lymphocytes  Relative 04/14/2016 17  % Final   Lymphs Abs 04/14/2016 1.0  0.7 - 4.0 K/uL Final   Monocytes Relative 04/14/2016 10  % Final   Monocytes Absolute 04/14/2016 0.7  0.1 - 1.0 K/uL Final  Eosinophils Relative 04/14/2016 1  % Final   Eosinophils Absolute 04/14/2016 0.1  0.0 - 0.7 K/uL Final   Basophils Relative 04/14/2016 0  % Final   Basophils Absolute 04/14/2016 0.0  0.0 - 0.1 K/uL Final   Specimen Description 04/14/2016 BLOOD RAC   Final   Special Requests 04/14/2016 BOTTLES DRAWN AEROBIC AND ANAEROBIC 10CC   Final   Culture 04/14/2016    Final                   Value:NO GROWTH 5 DAYS Performed at [facility]    Report Status 04/14/2016 04/19/2016 FINAL   Final   Specimen Description 04/14/2016 BLOOD RIGHT HAND   Final   Special Requests 04/14/2016 BOTTLES DRAWN AEROBIC AND ANAEROBIC 5CC   Final   Culture 04/14/2016    Final                   Value:NO GROWTH 5 DAYS Performed at [facility]    Report Status 04/14/2016 04/19/2016 FINAL   Final   Order Confirmation 04/14/2016 ORDER PROCESSED BY BLOOD BANK   Final   WBC 04/15/2016 6.2  4.0 - 10.5 K/uL Final   Comment: REPEATED TO VERIFY WHITE COUNT CONFIRMED ON SMEAR    RBC 04/15/2016 3.66 (L)  3.87 - 5.11 MIL/uL Final   Hemoglobin 04/15/2016 11.4 (L)  12.0 - 15.0 g/dL Final   HCT 82/95/6213 33.4 (L)  36.0 - 46.0 % Final   MCV 04/15/2016 91.3  78.0 - 100.0 fL Final   MCH 04/15/2016 31.1  26.0 - 34.0 pg Final   MCHC 04/15/2016 34.1  30.0 - 36.0 g/dL Final   RDW 08/65/7846 14.7  11.5 - 15.5 % Final   Platelets 04/15/2016 132 (L)  150 - 400 K/uL Final    RADIOGRAPHIC STUDIES: I have personally reviewed the radiological images as listed and agree with the findings in the report  No results found.  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  Oral supplementation with the standard multivitamin preparation is often inadequate to manage.  More common following malabsorptive bypass procedures but may also occur following restrictive procedures as well.   Multiple micronutrient deficiencies occur, including the major hematinic factors, iron and vitamin B12. Deficiencies of vitamins B1, A, K, D, and E are seen.   Copper deficiency has also been reported after RYGB, which can cause hematological abnormalities with or without associated neurological complications  Anemia:  Likely a consequence of surgical malabsorption.  Incidence of anemia following gastric bypass is 12% to 30%, mostly ascribed to micronutrient including vitamin-deficiency states.  Despite oral supplementation and normal blood concentrations of iron, copper, folic acid, and vitamin B12, many patients display persistent anemia after RYGB. Hematological complications are of clinical significance since anemia often has serious comorbid consequences.  Therapeutics:  Will obtain CBC with diff, CMP, ferritin, B12, folate, vitamin ADEK levels, copper, zinc.   Consider GI consult for of endoscopy to exonerate the GIT for bleeding and to evaluate for H pylori.    Patient has symptomatic anemia with Hgb < 10  and has not benefited from oral iron; therefore we will arrange for IV iron replacement.     We will also initiate  parenteral replacement of vitamin B12 and recommend oral folic acid so that everything is there for hematopoiesis.    A discussion regarding risks was had with the patient.  IV iron has the potential to cause allergic reactions, including potentially life-threatening anaphylaxis.   IV iron may be associated with non-allergic infusion reactions including self-limiting urticaria, palpitations, dizziness, and neck and back spasm; generally, these occur in  <1 percent of individuals and do not progress to more serious reactions. The non-allergic reaction consisting of flushing of the face and myalgias of the chest and back.   After discussion of the risks and benefits of IV iron therapy patient has elected to proceed with parenteral iron therapy    Cancer Staging  No matching staging information was found for the patient.    No problem-specific Assessment & Plan notes found for this encounter.    Orders Placed This Encounter  Procedures   CBC with Differential/Platelet    Standing Status:   Future    Standing Expiration Date:   05/29/2024   Copper, serum    Standing Status:   Future    Standing Expiration Date:   05/29/2024   Ferritin    Standing Status:   Future    Standing Expiration Date:   05/29/2024   Folate    Standing Status:   Future    Standing Expiration Date:   05/29/2024   Vitamin B12    Standing Status:   Future    Standing Expiration Date:   05/29/2024   Haptoglobin    Standing Status:   Future    Standing Expiration Date:   05/29/2024   VITAMIN D 25 Hydroxy (Vit-D Deficiency, Fractures)    Standing Status:   Future    Standing Expiration Date:   05/29/2024   Vitamin E    Standing Status:   Future    Standing Expiration Date:   05/29/2024   Vitamin K1, Serum    Standing Status:   Future    Standing Expiration Date:   05/29/2024   Vitamin A    Standing Status:   Future    Standing Expiration Date:   05/29/2024   Direct antiglobulin test (not at [facility])    Standing Status:   Future    Standing Expiration Date:   05/29/2024    45  minutes was spent in patient care.  This included time spent preparing to see the patient (e.g., review of tests), obtaining and/or reviewing separately obtained history, counseling and educating the patient/family/caregiver, ordering medications, tests, or procedures; documenting clinical information in the electronic or other health record, independently interpreting results and communicating results to  the patient/family/caregiver as well as coordination of care.       All questions were answered. The patient knows to call the clinic with any problems, questions or concerns.  This note was electronically signed.    Loni Muse, MD  05/30/2023 9:27 AM

## 2023-05-31 ENCOUNTER — Other Ambulatory Visit: Payer: Self-pay | Admitting: Pharmacist

## 2023-05-31 ENCOUNTER — Encounter: Payer: Self-pay | Admitting: Oncology

## 2023-05-31 LAB — HAPTOGLOBIN: Haptoglobin: 192 mg/dL (ref 33–278)

## 2023-05-31 NOTE — Addendum Note (Signed)
Addended by: Domenic Schwab on: 05/31/2023 01:07 PM   Modules accepted: Orders

## 2023-06-01 ENCOUNTER — Other Ambulatory Visit: Payer: Self-pay

## 2023-06-01 MED ORDER — FOLIC ACID 1 MG PO TABS: 1.0000 mg | ORAL_TABLET | Freq: Every day | ORAL | Status: AC

## 2023-06-02 LAB — COPPER, SERUM: Copper: 63 ug/dL — ABNORMAL LOW (ref 80–158)

## 2023-06-03 ENCOUNTER — Encounter: Payer: Self-pay | Admitting: Oncology

## 2023-06-03 LAB — VITAMIN K1, SERUM: VITAMIN K1: 0.1 ng/mL (ref 0.10–2.20)

## 2023-06-03 MED FILL — Iron Sucrose Inj 20 MG/ML (Fe Equiv): INTRAVENOUS | Qty: 10 | Status: AC

## 2023-06-03 NOTE — Addendum Note (Signed)
Addended by: Domenic Schwab on: 06/03/2023 03:08 PM   Modules accepted: Orders

## 2023-06-06 ENCOUNTER — Inpatient Hospital Stay: Payer: 59

## 2023-06-06 VITALS — BP 109/77 | HR 70 | Temp 98.2°F | Resp 14 | Ht 63.0 in | Wt 139.0 lb

## 2023-06-06 DIAGNOSIS — D509 Iron deficiency anemia, unspecified: Secondary | ICD-10-CM | POA: Diagnosis not present

## 2023-06-06 DIAGNOSIS — D539 Nutritional anemia, unspecified: Secondary | ICD-10-CM

## 2023-06-06 LAB — VITAMIN E
Vitamin E (Alpha Tocopherol): 8.9 mg/L (ref 5.9–19.4)
Vitamin E(Gamma Tocopherol): 0.4 mg/L — ABNORMAL LOW (ref 0.7–4.9)

## 2023-06-06 LAB — VITAMIN A: Vitamin A (Retinoic Acid): 74.5 ug/dL — ABNORMAL HIGH (ref 18.9–57.3)

## 2023-06-06 MED ORDER — SODIUM CHLORIDE 0.9 % IV SOLN
200.0000 mg | Freq: Once | INTRAVENOUS | Status: AC
  Administered 2023-06-06: 200 mg via INTRAVENOUS
  Filled 2023-06-06: qty 200

## 2023-06-06 MED ORDER — ACETAMINOPHEN 325 MG PO TABS
650.0000 mg | ORAL_TABLET | Freq: Once | ORAL | Status: AC
  Administered 2023-06-06: 650 mg via ORAL
  Filled 2023-06-06: qty 2

## 2023-06-06 MED ORDER — CYANOCOBALAMIN 1000 MCG/ML IJ SOLN
1000.0000 ug | Freq: Once | INTRAMUSCULAR | Status: AC
  Administered 2023-06-06: 1000 ug via INTRAMUSCULAR
  Filled 2023-06-06: qty 1

## 2023-06-06 MED ORDER — LORATADINE 10 MG PO TABS
10.0000 mg | ORAL_TABLET | Freq: Once | ORAL | Status: AC
  Administered 2023-06-06: 10 mg via ORAL
  Filled 2023-06-06: qty 1

## 2023-06-06 MED ORDER — SODIUM CHLORIDE 0.9 % IV SOLN: Freq: Once | INTRAVENOUS | Status: AC

## 2023-06-06 NOTE — Patient Instructions (Signed)
Vitamin B12 Injection What is this medication? Vitamin B12 (VAHY tuh min B12) prevents and treats low vitamin B12 levels in your body. It is used in people who do not get enough vitamin B12 from their diet or when their digestive tract does not absorb enough. Vitamin B12 plays an important role in maintaining the health of your nervous system and red blood cells. This medicine may be used for other purposes; ask your health care provider or pharmacist if you have questions. COMMON BRAND NAME(S): B-12 Compliance Kit, B-12 Injection Kit, Cyomin, Dodex, LA-12, Nutri-Twelve, Physicians EZ Use B-12, Primabalt, Vitamin Deficiency Injectable System - B12 What should I tell my care team before I take this medication? They need to know if you have any of these conditions: Kidney disease Leber's disease Megaloblastic anemia An unusual or allergic reaction to cyanocobalamin, cobalt, other medications, foods, dyes, or preservatives Pregnant or trying to get pregnant Breast-feeding How should I use this medication? This medication is injected into a muscle or deeply under the skin. It is usually given in a clinic or care team's office. However, your care team may teach you how to inject yourself. Follow all instructions. Talk to your care team about the use of this medication in children. Special care may be needed. Overdosage: If you think you have taken too much of this medicine contact a poison control center or emergency room at once. NOTE: This medicine is only for you. Do not share this medicine with others. What if I miss a dose? If you are given your dose at a clinic or care team's office, call to reschedule your appointment. If you give your own injections, and you miss a dose, take it as soon as you can. If it is almost time for your next dose, take only that dose. Do not take double or extra doses. What may interact with this medication? Alcohol Colchicine This list may not describe all possible  interactions. Give your health care provider a list of all the medicines, herbs, non-prescription drugs, or dietary supplements you use. Also tell them if you smoke, drink alcohol, or use illegal drugs. Some items may interact with your medicine. What should I watch for while using this medication? Visit your care team regularly. You may need blood work done while you are taking this medication. You may need to follow a special diet. Talk to your care team. Limit your alcohol intake and avoid smoking to get the best benefit. What side effects may I notice from receiving this medication? Side effects that you should report to your care team as soon as possible: Allergic reactions--skin rash, itching, hives, swelling of the face, lips, tongue, or throat Swelling of the ankles, hands, or feet Trouble breathing Side effects that usually do not require medical attention (report to your care team if they continue or are bothersome): Diarrhea This list may not describe all possible side effects. Call your doctor for medical advice about side effects. You may report side effects to FDA at 1-800-FDA-1088. Where should I keep my medication? Keep out of the reach of children. Store at room temperature between 15 and 30 degrees C (59 and 85 degrees F). Protect from light. Throw away any unused medication after the expiration date. NOTE: This sheet is a summary. It may not cover all possible information. If you have questions about this medicine, talk to your doctor, pharmacist, or health care provider.  2024 Elsevier/Gold Standard (2021-05-19 00:00:00) Iron Sucrose Injection What is this medication? IRON  SUCROSE (EYE ern SOO krose) treats low levels of iron (iron deficiency anemia) in people with kidney disease. Iron is a mineral that plays an important role in making red blood cells, which carry oxygen from your lungs to the rest of your body. This medicine may be used for other purposes; ask your health  care provider or pharmacist if you have questions. COMMON BRAND NAME(S): Venofer What should I tell my care team before I take this medication? They need to know if you have any of these conditions: Anemia not caused by low iron levels Heart disease High levels of iron in the blood Kidney disease Liver disease An unusual or allergic reaction to iron, other medications, foods, dyes, or preservatives Pregnant or trying to get pregnant Breastfeeding How should I use this medication? This medication is for infusion into a vein. It is given in a hospital or clinic setting. Talk to your care team about the use of this medication in children. While this medication may be prescribed for children as young as 2 years for selected conditions, precautions do apply. Overdosage: If you think you have taken too much of this medicine contact a poison control center or emergency room at once. NOTE: This medicine is only for you. Do not share this medicine with others. What if I miss a dose? Keep appointments for follow-up doses. It is important not to miss your dose. Call your care team if you are unable to keep an appointment. What may interact with this medication? Do not take this medication with any of the following: Deferoxamine Dimercaprol Other iron products This medication may also interact with the following: Chloramphenicol Deferasirox This list may not describe all possible interactions. Give your health care provider a list of all the medicines, herbs, non-prescription drugs, or dietary supplements you use. Also tell them if you smoke, drink alcohol, or use illegal drugs. Some items may interact with your medicine. What should I watch for while using this medication? Visit your care team regularly. Tell your care team if your symptoms do not start to get better or if they get worse. You may need blood work done while you are taking this medication. You may need to follow a special diet. Talk  to your care team. Foods that contain iron include: whole grains/cereals, dried fruits, beans, or peas, leafy green vegetables, and organ meats (liver, kidney). What side effects may I notice from receiving this medication? Side effects that you should report to your care team as soon as possible: Allergic reactions--skin rash, itching, hives, swelling of the face, lips, tongue, or throat Low blood pressure--dizziness, feeling faint or lightheaded, blurry vision Shortness of breath Side effects that usually do not require medical attention (report to your care team if they continue or are bothersome): Flushing Headache Joint pain Muscle pain Nausea Pain, redness, or irritation at injection site This list may not describe all possible side effects. Call your doctor for medical advice about side effects. You may report side effects to FDA at 1-800-FDA-1088. Where should I keep my medication? This medication is given in a hospital or clinic. It will not be stored at home. NOTE: This sheet is a summary. It may not cover all possible information. If you have questions about this medicine, talk to your doctor, pharmacist, or health care provider.  2024 Elsevier/Gold Standard (2023-02-11 00:00:00)

## 2023-06-07 MED FILL — Iron Sucrose Inj 20 MG/ML (Fe Equiv): INTRAVENOUS | Qty: 10 | Status: AC

## 2023-06-08 ENCOUNTER — Inpatient Hospital Stay: Payer: 59

## 2023-06-08 VITALS — BP 114/78 | HR 65 | Temp 98.4°F | Resp 18 | Wt 140.0 lb

## 2023-06-08 DIAGNOSIS — D509 Iron deficiency anemia, unspecified: Secondary | ICD-10-CM | POA: Diagnosis not present

## 2023-06-08 DIAGNOSIS — D539 Nutritional anemia, unspecified: Secondary | ICD-10-CM

## 2023-06-08 MED ORDER — SODIUM CHLORIDE 0.9 % IV SOLN
200.0000 mg | Freq: Once | INTRAVENOUS | Status: AC
  Administered 2023-06-08: 200 mg via INTRAVENOUS
  Filled 2023-06-08: qty 200

## 2023-06-08 MED ORDER — LORATADINE 10 MG PO TABS: 10.0000 mg | ORAL_TABLET | Freq: Once | ORAL | Status: DC

## 2023-06-08 MED ORDER — ACETAMINOPHEN 325 MG PO TABS: 650.0000 mg | ORAL_TABLET | Freq: Once | ORAL | Status: DC

## 2023-06-08 MED ORDER — SODIUM CHLORIDE 0.9 % IV SOLN: Freq: Once | INTRAVENOUS | Status: AC

## 2023-06-08 NOTE — Patient Instructions (Signed)
Iron Sucrose Injection What is this medication? IRON SUCROSE (EYE ern SOO krose) treats low levels of iron (iron deficiency anemia) in people with kidney disease. Iron is a mineral that plays an important role in making red blood cells, which carry oxygen from your lungs to the rest of your body. This medicine may be used for other purposes; ask your health care provider or pharmacist if you have questions. COMMON BRAND NAME(S): Venofer What should I tell my care team before I take this medication? They need to know if you have any of these conditions: Anemia not caused by low iron levels Heart disease High levels of iron in the blood Kidney disease Liver disease An unusual or allergic reaction to iron, other medications, foods, dyes, or preservatives Pregnant or trying to get pregnant Breastfeeding How should I use this medication? This medication is for infusion into a vein. It is given in a hospital or clinic setting. Talk to your care team about the use of this medication in children. While this medication may be prescribed for children as young as 2 years for selected conditions, precautions do apply. Overdosage: If you think you have taken too much of this medicine contact a poison control center or emergency room at once. NOTE: This medicine is only for you. Do not share this medicine with others. What if I miss a dose? Keep appointments for follow-up doses. It is important not to miss your dose. Call your care team if you are unable to keep an appointment. What may interact with this medication? Do not take this medication with any of the following: Deferoxamine Dimercaprol Other iron products This medication may also interact with the following: Chloramphenicol Deferasirox This list may not describe all possible interactions. Give your health care provider a list of all the medicines, herbs, non-prescription drugs, or dietary supplements you use. Also tell them if you smoke,  drink alcohol, or use illegal drugs. Some items may interact with your medicine. What should I watch for while using this medication? Visit your care team regularly. Tell your care team if your symptoms do not start to get better or if they get worse. You may need blood work done while you are taking this medication. You may need to follow a special diet. Talk to your care team. Foods that contain iron include: whole grains/cereals, dried fruits, beans, or peas, leafy green vegetables, and organ meats (liver, kidney). What side effects may I notice from receiving this medication? Side effects that you should report to your care team as soon as possible: Allergic reactions--skin rash, itching, hives, swelling of the face, lips, tongue, or throat Low blood pressure--dizziness, feeling faint or lightheaded, blurry vision Shortness of breath Side effects that usually do not require medical attention (report to your care team if they continue or are bothersome): Flushing Headache Joint pain Muscle pain Nausea Pain, redness, or irritation at injection site This list may not describe all possible side effects. Call your doctor for medical advice about side effects. You may report side effects to FDA at 1-800-FDA-1088. Where should I keep my medication? This medication is given in a hospital or clinic. It will not be stored at home. NOTE: This sheet is a summary. It may not cover all possible information. If you have questions about this medicine, talk to your doctor, pharmacist, or health care provider.  2024 Elsevier/Gold Standard (2023-02-11 00:00:00)

## 2023-06-09 MED FILL — Iron Sucrose Inj 20 MG/ML (Fe Equiv): INTRAVENOUS | Qty: 10 | Status: AC

## 2023-06-10 ENCOUNTER — Inpatient Hospital Stay: Payer: 59

## 2023-06-10 ENCOUNTER — Encounter: Payer: Self-pay | Admitting: Oncology

## 2023-06-10 VITALS — BP 123/83 | HR 62 | Resp 16

## 2023-06-10 DIAGNOSIS — D509 Iron deficiency anemia, unspecified: Secondary | ICD-10-CM | POA: Diagnosis not present

## 2023-06-10 DIAGNOSIS — D539 Nutritional anemia, unspecified: Secondary | ICD-10-CM

## 2023-06-10 MED ORDER — SODIUM CHLORIDE 0.9 % IV SOLN
200.0000 mg | Freq: Once | INTRAVENOUS | Status: AC
  Administered 2023-06-10: 200 mg via INTRAVENOUS
  Filled 2023-06-10: qty 200

## 2023-06-10 MED ORDER — ACETAMINOPHEN 325 MG PO TABS: 650.0000 mg | ORAL_TABLET | Freq: Once | ORAL | Status: DC

## 2023-06-10 MED ORDER — LORATADINE 10 MG PO TABS: 10.0000 mg | ORAL_TABLET | Freq: Once | ORAL | Status: DC

## 2023-06-10 MED ORDER — SODIUM CHLORIDE 0.9 % IV SOLN: Freq: Once | INTRAVENOUS | Status: AC

## 2023-06-10 MED FILL — Iron Sucrose Inj 20 MG/ML (Fe Equiv): INTRAVENOUS | Qty: 10 | Status: AC

## 2023-06-10 NOTE — Addendum Note (Signed)
Addended by: Domenic Schwab on: 06/10/2023 04:37 PM   Modules accepted: Orders

## 2023-06-13 ENCOUNTER — Inpatient Hospital Stay: Payer: 59

## 2023-06-13 VITALS — BP 103/67 | HR 61 | Temp 97.6°F | Resp 18 | Wt 136.8 lb

## 2023-06-13 DIAGNOSIS — D509 Iron deficiency anemia, unspecified: Secondary | ICD-10-CM | POA: Diagnosis not present

## 2023-06-13 DIAGNOSIS — D539 Nutritional anemia, unspecified: Secondary | ICD-10-CM

## 2023-06-13 MED ORDER — LORATADINE 10 MG PO TABS: 10.0000 mg | ORAL_TABLET | Freq: Once | ORAL | Status: DC

## 2023-06-13 MED ORDER — SODIUM CHLORIDE 0.9 % IV SOLN: Freq: Once | INTRAVENOUS | Status: AC

## 2023-06-13 MED ORDER — ACETAMINOPHEN 325 MG PO TABS: 650.0000 mg | ORAL_TABLET | Freq: Once | ORAL | Status: DC

## 2023-06-13 MED ORDER — SODIUM CHLORIDE 0.9 % IV SOLN
200.0000 mg | Freq: Once | INTRAVENOUS | Status: AC
  Administered 2023-06-13: 200 mg via INTRAVENOUS
  Filled 2023-06-13: qty 10

## 2023-06-13 NOTE — Patient Instructions (Signed)
Iron Sucrose Injection What is this medication? IRON SUCROSE (EYE ern SOO krose) treats low levels of iron (iron deficiency anemia) in people with kidney disease. Iron is a mineral that plays an important role in making red blood cells, which carry oxygen from your lungs to the rest of your body. This medicine may be used for other purposes; ask your health care provider or pharmacist if you have questions. COMMON BRAND NAME(S): Venofer What should I tell my care team before I take this medication? They need to know if you have any of these conditions: Anemia not caused by low iron levels Heart disease High levels of iron in the blood Kidney disease Liver disease An unusual or allergic reaction to iron, other medications, foods, dyes, or preservatives Pregnant or trying to get pregnant Breastfeeding How should I use this medication? This medication is for infusion into a vein. It is given in a hospital or clinic setting. Talk to your care team about the use of this medication in children. While this medication may be prescribed for children as young as 2 years for selected conditions, precautions do apply. Overdosage: If you think you have taken too much of this medicine contact a poison control center or emergency room at once. NOTE: This medicine is only for you. Do not share this medicine with others. What if I miss a dose? Keep appointments for follow-up doses. It is important not to miss your dose. Call your care team if you are unable to keep an appointment. What may interact with this medication? Do not take this medication with any of the following: Deferoxamine Dimercaprol Other iron products This medication may also interact with the following: Chloramphenicol Deferasirox This list may not describe all possible interactions. Give your health care provider a list of all the medicines, herbs, non-prescription drugs, or dietary supplements you use. Also tell them if you smoke,  drink alcohol, or use illegal drugs. Some items may interact with your medicine. What should I watch for while using this medication? Visit your care team regularly. Tell your care team if your symptoms do not start to get better or if they get worse. You may need blood work done while you are taking this medication. You may need to follow a special diet. Talk to your care team. Foods that contain iron include: whole grains/cereals, dried fruits, beans, or peas, leafy green vegetables, and organ meats (liver, kidney). What side effects may I notice from receiving this medication? Side effects that you should report to your care team as soon as possible: Allergic reactions--skin rash, itching, hives, swelling of the face, lips, tongue, or throat Low blood pressure--dizziness, feeling faint or lightheaded, blurry vision Shortness of breath Side effects that usually do not require medical attention (report to your care team if they continue or are bothersome): Flushing Headache Joint pain Muscle pain Nausea Pain, redness, or irritation at injection site This list may not describe all possible side effects. Call your doctor for medical advice about side effects. You may report side effects to FDA at 1-800-FDA-1088. Where should I keep my medication? This medication is given in a hospital or clinic. It will not be stored at home. NOTE: This sheet is a summary. It may not cover all possible information. If you have questions about this medicine, talk to your doctor, pharmacist, or health care provider.  2024 Elsevier/Gold Standard (2023-02-11 00:00:00)

## 2023-06-13 NOTE — Progress Notes (Signed)
4098: Patient reported burning & coldness at IV site. Infusion stopped, IV site flushed and blood return noted. IV repositioned and new dressing placed. Patient reports relief of burning.

## 2023-06-14 MED FILL — Iron Sucrose Inj 20 MG/ML (Fe Equiv): INTRAVENOUS | Qty: 10 | Status: AC

## 2023-06-15 ENCOUNTER — Inpatient Hospital Stay: Payer: 59

## 2023-06-15 VITALS — BP 111/72 | HR 71 | Resp 16 | Wt 137.0 lb

## 2023-06-15 DIAGNOSIS — D509 Iron deficiency anemia, unspecified: Secondary | ICD-10-CM | POA: Diagnosis not present

## 2023-06-15 DIAGNOSIS — D539 Nutritional anemia, unspecified: Secondary | ICD-10-CM

## 2023-06-15 MED ORDER — ACETAMINOPHEN 325 MG PO TABS: 650.0000 mg | ORAL_TABLET | Freq: Once | ORAL | Status: DC

## 2023-06-15 MED ORDER — HEPARIN SOD (PORK) LOCK FLUSH 100 UNIT/ML IV SOLN: 500.0000 [IU] | Freq: Once | INTRAVENOUS | Status: DC | PRN

## 2023-06-15 MED ORDER — SODIUM CHLORIDE 0.9 % IV SOLN: Freq: Once | INTRAVENOUS | Status: AC

## 2023-06-15 MED ORDER — SODIUM CHLORIDE 0.9% FLUSH: 10.0000 mL | Freq: Once | INTRAVENOUS | Status: DC | PRN

## 2023-06-15 MED ORDER — SODIUM CHLORIDE 0.9 % IV SOLN
200.0000 mg | Freq: Once | INTRAVENOUS | Status: AC
  Administered 2023-06-15: 200 mg via INTRAVENOUS
  Filled 2023-06-15: qty 200

## 2023-06-15 MED ORDER — LORATADINE 10 MG PO TABS: 10.0000 mg | ORAL_TABLET | Freq: Once | ORAL | Status: DC

## 2023-06-27 ENCOUNTER — Inpatient Hospital Stay: Payer: 59 | Attending: Oncology | Admitting: Oncology

## 2023-06-27 ENCOUNTER — Inpatient Hospital Stay: Payer: 59

## 2023-06-27 VITALS — BP 105/73 | HR 82 | Temp 98.2°F | Resp 12 | Ht 63.0 in | Wt 143.2 lb

## 2023-06-27 DIAGNOSIS — K912 Postsurgical malabsorption, not elsewhere classified: Secondary | ICD-10-CM | POA: Insufficient documentation

## 2023-06-27 DIAGNOSIS — D539 Nutritional anemia, unspecified: Secondary | ICD-10-CM | POA: Diagnosis not present

## 2023-06-27 DIAGNOSIS — K859 Acute pancreatitis without necrosis or infection, unspecified: Secondary | ICD-10-CM

## 2023-06-27 DIAGNOSIS — Z9884 Bariatric surgery status: Secondary | ICD-10-CM | POA: Insufficient documentation

## 2023-06-27 DIAGNOSIS — K909 Intestinal malabsorption, unspecified: Secondary | ICD-10-CM | POA: Diagnosis not present

## 2023-06-27 DIAGNOSIS — K8050 Calculus of bile duct without cholangitis or cholecystitis without obstruction: Secondary | ICD-10-CM | POA: Insufficient documentation

## 2023-06-27 DIAGNOSIS — D509 Iron deficiency anemia, unspecified: Secondary | ICD-10-CM | POA: Insufficient documentation

## 2023-06-27 LAB — CBC WITH DIFFERENTIAL/PLATELET
Abs Immature Granulocytes: 0.01 10*3/uL (ref 0.00–0.07)
Basophils Absolute: 0 10*3/uL (ref 0.0–0.1)
Basophils Relative: 1 %
Eosinophils Absolute: 0.1 10*3/uL (ref 0.0–0.5)
Eosinophils Relative: 2 %
HCT: 37.9 % (ref 36.0–46.0)
Hemoglobin: 11.6 g/dL — ABNORMAL LOW (ref 12.0–15.0)
Immature Granulocytes: 0 %
Lymphocytes Relative: 33 %
Lymphs Abs: 1.5 10*3/uL (ref 0.7–4.0)
MCH: 24.4 pg — ABNORMAL LOW (ref 26.0–34.0)
MCHC: 30.6 g/dL (ref 30.0–36.0)
MCV: 79.6 fL — ABNORMAL LOW (ref 80.0–100.0)
Monocytes Absolute: 0.6 10*3/uL (ref 0.1–1.0)
Monocytes Relative: 13 %
Neutro Abs: 2.4 10*3/uL (ref 1.7–7.7)
Neutrophils Relative %: 51 %
Platelets: 309 10*3/uL (ref 150–400)
RBC: 4.76 MIL/uL (ref 3.87–5.11)
WBC: 4.6 10*3/uL (ref 4.0–10.5)
nRBC: 0 % (ref 0.0–0.2)

## 2023-06-27 LAB — FERRITIN: Ferritin: 37 ng/mL (ref 11–307)

## 2023-06-27 NOTE — Patient Instructions (Addendum)
Please begin Folic acid 2 mg daily

## 2023-06-27 NOTE — Progress Notes (Signed)
[facility] Initial Visit:  Patient Care Team: Julianne Handler, NP as PCP - General (Nurse Practitioner)  CHIEF COMPLAINTS/PURPOSE OF CONSULTATION:  HISTORY OF PRESENTING ILLNESS: Amanda [name] 32 y.o. female is here because of  anemia Medical history notable for eating disorder, polycystic ovary syndrome, gastric bypass, diabetes mellitus type 2  May 16, 2023: WBC 3.6 hemoglobin 9.0 MCV 63 platelet count 341; 54 segs 39 lymphs 7 meds CMP normal  May 30 2023:  [facility] Hematology Consult Patient underwent gastric bypass in September 2020 with loss of 100 lbs.  Patient is G2 P2001.  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  Has never received IV iron nor required PRBC's in the past.  Has tolerated oral iron from a GI standpoint.  Has a normal diet.  No history of postoperative or postpartum hemorrhage requiring transfusion.  No hematochezia, melena, hemoptysis, hematuria.  No history of intra-articular or soft tissue bleeding.  No history of abnormal bleeding in family members Patient has symptoms of fatigue, pallor,  cold intolerance, DOE, decreased performance status.  Patient has pica to ice but not starch/dirt.    WBC 3.7 hemoglobin 8.6 MCV 69 lately count 346; 47 segs 40 lymphs 11 monos 1 EO 1 basophil Direct Coombs test negative haptoglobin 192 Ferritin 2 folate 3.8 Copper 63 B12 309 25 hydroxy vitamin D52.3 Vitamin A 74.5 (ULN 57.3) Vitamin E alpha 8.9 vitamin K1  0.10  June 27 2023:  Scheduled follow up for anemia  June 06, 2023 through June 15, 2023: Received 1000 mg total of Venofer and 1000 mcg of vitamin B12  June 14 2023:  Bariatric surgery consult to evaluate RUQ pain  June 17 2023:  MRCP- No gallstones, gallbladder wall thickening, or biliary dilatation.  Hepatic parenchymal signal findings consistent with hepatic iron deposition.  Status post Roux  type gastric bypass. Moderate burden of stool.   June 27, 2023: Scheduled follow-up for anemia.  Developed a bout of pancreatitis since last visit.  Ice pica and cold intolerance have resolved.  Taking a bariatric multivitamin daily.  Begin folic acid 2 mg daily  Hgb 16.1 Ferritin 37   Review of Systems - Oncology  MEDICAL HISTORY: Past Medical History:  Diagnosis Date   Celiac disease    Polycystic ovarian syndrome     SURGICAL HISTORY: Past Surgical History:  Procedure Laterality Date   APPENDECTOMY     CESAREAN SECTION N/A 04/15/2016   Procedure: CESAREAN SECTION;  Surgeon: Tilda Burrow, MD;  Location: [facility] BIRTHING SUITES;  Service: Obstetrics;  Laterality: N/A;   foot surgery      GASTRIC BYPASS     TONSILLECTOMY      SOCIAL HISTORY: Social History   Socioeconomic History   Marital status: Married    Spouse name: Reita Cliche   Number of children: 1   Years of education: 12+2   Highest education level: Associate degree: academic program  Occupational History   Occupation: Social worker  Tobacco Use   Smoking status: Never   Smokeless tobacco: Never  Vaping Use   Vaping status: Never Used  Substance and Sexual Activity   Alcohol use: Never   Drug use: Never   Sexual activity: Not Currently  Other Topics Concern   Not on file  Social History Narrative   Not on file   Social Determinants of Health   Financial Resource Strain: Not on file  Food Insecurity: No Food Insecurity (05/30/2023)   Hunger Vital Sign    Worried About Running Out of Food in the Last Year: Never true    Ran Out of Food in the Last Year: Never true  Transportation Needs: No Transportation Needs (05/30/2023)   PRAPARE - Administrator, Civil Service (Medical): No    Lack of Transportation (Non-Medical): No  Physical Activity: Not on file  Stress: Not on file  Social Connections: Not on file  Intimate Partner Violence: Not At Risk (05/30/2023)   Humiliation, Afraid, Rape, and Kick  questionnaire    Fear of Current or Ex-Partner: No    Emotionally Abused: No    Physically Abused: No    Sexually Abused: No    FAMILY HISTORY No family history on file.  ALLERGIES:  is allergic to nsaids and gluten meal.  MEDICATIONS:  Current Outpatient Medications  Medication Sig Dispense Refill   buPROPion (WELLBUTRIN XL) 150 MG 24 hr tablet Take 150 mg by mouth every evening.     buPROPion (WELLBUTRIN XL) 300 MG 24 hr tablet Take 300 mg by mouth every morning.     calcium citrate (CALCITRATE - DOSED IN MG ELEMENTAL CALCIUM) 950 (200 Ca) MG tablet Take 200 mg of elemental calcium by mouth daily.     folic acid (FOLVITE) 1 MG tablet Take 1 tablet (1 mg total) by mouth daily.     metFORMIN (GLUCOPHAGE) 1000 MG tablet Take 1,000 mg by mouth 2 (two) times daily.     Multiple Vitamin (MULTIVITAMIN) tablet Take 1 tablet by mouth daily. Bariatric MVI     OZEMPIC, 2 MG/DOSE, 8 MG/3ML SOPN 2 mg once a week.     testosterone cypionate (DEPOTESTOSTERONE CYPIONATE) 200 MG/ML injection Inject into the skin.     No current facility-administered medications for this visit.    PHYSICAL EXAMINATION:  ECOG PERFORMANCE STATUS: 1 - Symptomatic but completely ambulatory   There were no vitals filed for this visit.   There were no vitals filed for this visit.    Physical Exam Vitals and nursing note reviewed.  Constitutional:      General: She is not in acute distress.    Appearance: Normal appearance. She is normal weight. She is not ill-appearing, toxic-appearing or diaphoretic.  HENT:     Head: Normocephalic and atraumatic.     Right Ear: External ear normal.     Left Ear: External ear normal.     Nose: Nose normal. No congestion or rhinorrhea.  Eyes:     General: No scleral icterus.    Extraocular Movements: Extraocular movements intact.     Conjunctiva/sclera: Conjunctivae normal.     Pupils: Pupils are equal, round, and reactive to light.  Cardiovascular:     Rate and  Rhythm: Normal rate and regular rhythm.     Heart sounds: No murmur heard.    No friction rub. No gallop.  Pulmonary:     Effort: Pulmonary effort is normal. No respiratory distress.     Breath sounds: Normal breath sounds. No wheezing or rales.  Abdominal:     General: Bowel sounds are normal.     Palpations: Abdomen is soft.     Tenderness: There is no abdominal tenderness. There is no guarding or rebound.  Musculoskeletal:        General: No swelling, tenderness or deformity.     Cervical back: Normal range of motion and neck supple. No rigidity or tenderness.     Right  lower leg: No edema.  Lymphadenopathy:     Head:     Right side of head: No submental, submandibular, tonsillar, preauricular, posterior auricular or occipital adenopathy.     Left side of head: No submental, submandibular, tonsillar, preauricular, posterior auricular or occipital adenopathy.     Cervical: No cervical adenopathy.     Right cervical: No superficial, deep or posterior cervical adenopathy.    Left cervical: No superficial, deep or posterior cervical adenopathy.     Upper Body:     Right upper body: No supraclavicular, axillary, pectoral or epitrochlear adenopathy.     Left upper body: No supraclavicular, axillary, pectoral or epitrochlear adenopathy.  Skin:    General: Skin is warm and dry.     Coloration: Skin is not jaundiced.  Neurological:     General: No focal deficit present.     Mental Status: She is alert and oriented to person, place, and time.     Cranial Nerves: No cranial nerve deficit.  Psychiatric:        Behavior: Behavior normal.        Thought Content: Thought content normal.        Judgment: Judgment normal.     Comments: Affect depressed     LABORATORY DATA: I have personally reviewed the data as listed:  Appointment on 05/30/2023  Component Date Value Ref Range Status   Vitamin A (Retinoic Acid) 05/30/2023 74.5 (H)  18.9 - 57.3 ug/dL Final   Comment: (NOTE) Reference  intervals for vitamin A determined from [facility] internal studies. Individuals with vitamin A less than 20 ug/dL are considered vitamin A deficient and those with serum concentrations less than 10 ug/dL are considered severely deficient. This test was developed and its performance characteristics determined by [facility]. It has not been cleared or approved by the Food and Drug Administration. Performed At: [facility] [address] [place], [place] [id number] Jolene Schimke MD [id number]    VITAMIN K1 05/30/2023 0.10  0.10 - 2.20 ng/mL Final   Comment: (NOTE) This test was developed and its performance characteristics determined by [facility]. It has not been cleared or approved by the Food and Drug Administration. Performed At: [facility] [address] [place], [place] [id number] Jolene Schimke MD [id number]:[id number]    Vitamin E (Alpha Tocopherol) 05/30/2023 8.9  5.9 - 19.4 mg/L Final   Comment: (NOTE) This test was developed and its performance characteristics determined by [facility]. It has not been cleared or approved by the Food and Drug Administration.    Vitamin E(Gamma Tocopherol) 05/30/2023 0.4 (L)  0.7 - 4.9 mg/L Corrected   Comment: (NOTE) This test was developed and its performance characteristics determined by [facility]. It has not been cleared or approved by the Food and Drug Administration. Reference intervals for alpha and gamma-tocopherol determined from [facility] and Nutrition Examination Survey, 2005-2006. Individuals with alpha-tocopherol levels less than 5.0 mg/L are considered vitamin E deficient. Performed At: [facility] [address] [place], [place] [id number] Jolene Schimke MD [id number]:[id number]    Vit D, 25-Hydroxy 05/30/2023 52.28  30 - 100 ng/mL Final   Comment: (NOTE) Vitamin D deficiency has been defined by the Institute of Medicine  and an Endocrine Society practice guideline as a level of serum 25-OH  vitamin D less than  20 ng/mL (1,2). The Endocrine Society went on to  further define vitamin D insufficiency as a level between 21 and 29  ng/mL (2).  1. IOM (Institute of Medicine). 2010. Dietary reference intakes  for  calcium and D. [place]: [employer]. 2. Holick MF, Binkley [place], Bischoff-Ferrari HA, et al. Evaluation,  treatment, and prevention of vitamin D deficiency: an Endocrine  Society clinical practice guideline, JCEM. 2011 Jul; 96(7): 1911-30.  Performed at [facility], [address] [place], [place] [address]    DAT, complement 05/30/2023 NEG   Final   DAT, IgG 05/30/2023    Final                   Value:NEG Performed at [facility], [address], [place], [place] [address]    Haptoglobin 05/30/2023 192  33 - 278 mg/dL Final   Comment: (NOTE) Performed At: [facility] [address] [place], [place] [id number] Jolene Schimke MD [id number]    Vitamin B-12 05/30/2023 309  180 - 914 pg/mL Final   Comment: (NOTE) This assay is not validated for testing neonatal or myeloproliferative syndrome specimens for Vitamin B12 levels. Performed at [facility], [address] [place], [place] [address]    Folate 05/30/2023 3.8 (L)  >5.9 ng/mL Final   Performed at [facility], [address], [place], [place] [address]   Ferritin 05/30/2023 2 (L)  11 - 307 ng/mL Final   Performed at [facility], [address] [place], [place] [address]   Copper 05/30/2023 63 (L)  80 - 158 ug/dL Final   Comment: (NOTE) This test was developed and its performance characteristics determined by [facility]. It has not been cleared or approved by the Food and Drug Administration.                                Detection Limit = 5 Performed At: [facility] [place] [address] [place], [place] [id number] Jolene Schimke MD [id number]    WBC 05/30/2023 3.7 (L)  4.0 - 10.5 K/uL Final   RBC  05/30/2023 4.44  3.87 - 5.11 MIL/uL Final   Hemoglobin 05/30/2023 8.6 (L)  12.0 - 15.0 g/dL Final   Comment: Reticulocyte Hemoglobin testing may be clinically indicated, consider ordering this additional test [id number]    HCT 05/30/2023 30.7 (L)  36.0 - 46.0 % Final   MCV 05/30/2023 69.1 (L)  80.0 - 100.0 fL Final   MCH 05/30/2023 19.4 (L)  26.0 - 34.0 pg Final   MCHC 05/30/2023 28.0 (L)  30.0 - 36.0 g/dL Final   RDW 43/32/9518 19.1 (H)  11.5 - 15.5 % Final   Platelets 05/30/2023 346  150 - 400 K/uL Final   nRBC 05/30/2023 0.0  0.0 - 0.2 % Final   Neutrophils Relative % 05/30/2023 47  % Final   Neutro Abs 05/30/2023 1.7  1.7 - 7.7 K/uL Final   Lymphocytes Relative 05/30/2023 40  % Final   Lymphs Abs 05/30/2023 1.5  0.7 - 4.0 K/uL Final   Monocytes Relative 05/30/2023 11  % Final   Monocytes Absolute 05/30/2023 0.4  0.1 - 1.0 K/uL Final   Eosinophils Relative 05/30/2023 1  % Final   Eosinophils Absolute 05/30/2023 0.1  0.0 - 0.5 K/uL Final   Basophils Relative 05/30/2023 1  % Final   Basophils Absolute 05/30/2023 0.0  0.0 - 0.1 K/uL Final   Immature Granulocytes 05/30/2023 0  % Final   Abs Immature Granulocytes 05/30/2023 0.01  0.00 - 0.07 K/uL Final   Performed at [facility], [address] [place], [place] [address]    RADIOGRAPHIC STUDIES: I have personally reviewed the radiological images as listed and agree with the findings in the report  No results found.  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    Roux-en Y:  Weight loss is achieved by combining two techniques:  food restriction (partial gastrectomy) and malabsorption (diversion)  Surgical malabsorption:  A consequence of bariatric surgery.  Oral supplementation with the standard multivitamin preparation is often inadequate to manage.  More common following malabsorptive bypass procedures but may  also occur following restrictive procedures as well.   Multiple micronutrient deficiencies occur, including the major hematinic factors, iron and vitamin B12. Deficiencies of vitamins B1, A, K, D, and E are seen.   Copper deficiency has also been reported after RYGB, which can cause hematological abnormalities with or without associated neurological complications  Anemia:  Likely a consequence of surgical malabsorption.  Incidence of anemia following gastric bypass is 12% to 30%, mostly ascribed to micronutrient including vitamin-deficiency states.  Despite oral supplementation and normal blood concentrations of iron, copper, folic acid, and vitamin B12, many patients display persistent anemia after RYGB. Hematological complications are of clinical significance since anemia often has serious comorbid consequences.  June 06, 2023 through June 15, 2023: Received 1000 mg total of Venofer and 1000 mcg of vitamin B12   June 27 2023-  Hgb 11.6 Ferritin 37.  Will arrange for patient to receive another course of IV iron.  Continue B12 and folate replacement.    RUQ abdominal pain June 27 2023- Likely due to gallstone pancreatitis.  Patient at risk for gallstones due to significant weight loss.  Absence of gallstones on imaging may indicate that patient passed them by the time she presented with pain.  Lipid panel in September 2024 showed normal triglycerides.      Cancer Staging  No matching staging information was found for the patient.    No problem-specific Assessment & Plan notes found for this encounter.    No orders of the defined types were placed in this encounter.   40  minutes was spent in patient care.  This included time spent preparing to see the patient (e.g., review of tests), obtaining and/or reviewing separately obtained history, counseling and educating the patient/family/caregiver, ordering medications, tests, or procedures; documenting clinical information in the  electronic or other health record, independently interpreting results and communicating results to the patient/family/caregiver as well as coordination of care.       All questions were answered. The patient knows to call the clinic with any problems, questions or concerns.  This note was electronically signed.    Loni Muse, MD  06/27/2023 8:59 AM

## 2023-07-06 ENCOUNTER — Inpatient Hospital Stay: Payer: 59

## 2023-07-06 ENCOUNTER — Telehealth: Payer: Self-pay | Admitting: Oncology

## 2023-07-06 VITALS — BP 103/64 | HR 71 | Temp 97.6°F | Wt 144.1 lb

## 2023-07-06 DIAGNOSIS — D509 Iron deficiency anemia, unspecified: Secondary | ICD-10-CM | POA: Diagnosis not present

## 2023-07-06 DIAGNOSIS — D539 Nutritional anemia, unspecified: Secondary | ICD-10-CM

## 2023-07-06 MED ORDER — CYANOCOBALAMIN 1000 MCG/ML IJ SOLN
1000.0000 ug | Freq: Once | INTRAMUSCULAR | Status: AC
  Administered 2023-07-06: 1000 ug via INTRAMUSCULAR
  Filled 2023-07-06: qty 1

## 2023-07-06 NOTE — Patient Instructions (Signed)
 Vitamin B12 Injection What is this medication? Vitamin B12 (VAHY tuh min B12) prevents and treats low vitamin B12 levels in your body. It is used in people who do not get enough vitamin B12 from their diet or when their digestive tract does not absorb enough. Vitamin B12 plays an important role in maintaining the health of your nervous system and red blood cells. This medicine may be used for other purposes; ask your health care provider or pharmacist if you have questions. COMMON BRAND NAME(S): B-12 Compliance Kit, B-12 Injection Kit, Cyomin, Dodex, LA-12, Nutri-Twelve, Physicians EZ Use B-12, Primabalt, Vitamin Deficiency Injectable System - B12 What should I tell my care team before I take this medication? They need to know if you have any of these conditions: Kidney disease Leber's disease Megaloblastic anemia An unusual or allergic reaction to cyanocobalamin, cobalt, other medications, foods, dyes, or preservatives Pregnant or trying to get pregnant Breast-feeding How should I use this medication? This medication is injected into a muscle or deeply under the skin. It is usually given in a clinic or care team's office. However, your care team may teach you how to inject yourself. Follow all instructions. Talk to your care team about the use of this medication in children. Special care may be needed. Overdosage: If you think you have taken too much of this medicine contact a poison control center or emergency room at once. NOTE: This medicine is only for you. Do not share this medicine with others. What if I miss a dose? If you are given your dose at a clinic or care team's office, call to reschedule your appointment. If you give your own injections, and you miss a dose, take it as soon as you can. If it is almost time for your next dose, take only that dose. Do not take double or extra doses. What may interact with this medication? Alcohol Colchicine This list may not describe all possible  interactions. Give your health care provider a list of all the medicines, herbs, non-prescription drugs, or dietary supplements you use. Also tell them if you smoke, drink alcohol, or use illegal drugs. Some items may interact with your medicine. What should I watch for while using this medication? Visit your care team regularly. You may need blood work done while you are taking this medication. You may need to follow a special diet. Talk to your care team. Limit your alcohol intake and avoid smoking to get the best benefit. What side effects may I notice from receiving this medication? Side effects that you should report to your care team as soon as possible: Allergic reactions--skin rash, itching, hives, swelling of the face, lips, tongue, or throat Swelling of the ankles, hands, or feet Trouble breathing Side effects that usually do not require medical attention (report to your care team if they continue or are bothersome): Diarrhea This list may not describe all possible side effects. Call your doctor for medical advice about side effects. You may report side effects to FDA at 1-800-FDA-1088. Where should I keep my medication? Keep out of the reach of children. Store at room temperature between 15 and 30 degrees C (59 and 85 degrees F). Protect from light. Throw away any unused medication after the expiration date. NOTE: This sheet is a summary. It may not cover all possible information. If you have questions about this medicine, talk to your doctor, pharmacist, or health care provider.  2024 Elsevier/Gold Standard (2021-05-19 00:00:00)

## 2023-07-06 NOTE — Telephone Encounter (Signed)
07/06/2023  Patient has missed the last couple of B12 Injections including today.  Left Message for patient to return call to let us know if she can come in today

## 2023-07-28 ENCOUNTER — Encounter: Payer: Self-pay | Admitting: Oncology

## 2023-07-28 NOTE — Addendum Note (Signed)
Addended by: Domenic Schwab on: 07/28/2023 08:41 AM   Modules accepted: Orders

## 2023-08-01 ENCOUNTER — Ambulatory Visit: Payer: 59

## 2023-08-03 ENCOUNTER — Inpatient Hospital Stay: Payer: 59 | Attending: Oncology

## 2023-08-03 ENCOUNTER — Ambulatory Visit: Payer: 59

## 2023-08-04 ENCOUNTER — Encounter: Payer: Self-pay | Admitting: Oncology

## 2023-08-04 NOTE — Addendum Note (Signed)
Addended by: Domenic Schwab on: 08/04/2023 04:44 PM   Modules accepted: Orders

## 2023-08-05 ENCOUNTER — Inpatient Hospital Stay: Payer: 59 | Attending: Oncology

## 2023-08-05 VITALS — BP 112/76 | HR 112 | Temp 97.9°F | Resp 18 | Wt 145.0 lb

## 2023-08-05 DIAGNOSIS — K912 Postsurgical malabsorption, not elsewhere classified: Secondary | ICD-10-CM | POA: Insufficient documentation

## 2023-08-05 DIAGNOSIS — D509 Iron deficiency anemia, unspecified: Secondary | ICD-10-CM | POA: Insufficient documentation

## 2023-08-05 DIAGNOSIS — D539 Nutritional anemia, unspecified: Secondary | ICD-10-CM

## 2023-08-05 MED ORDER — ACETAMINOPHEN 325 MG PO TABS: 650.0000 mg | ORAL_TABLET | Freq: Once | ORAL | Status: DC

## 2023-08-05 MED ORDER — LORATADINE 10 MG PO TABS: 10.0000 mg | ORAL_TABLET | Freq: Once | ORAL | Status: DC

## 2023-08-05 MED ORDER — CYANOCOBALAMIN 1000 MCG/ML IJ SOLN
1000.0000 ug | Freq: Once | INTRAMUSCULAR | Status: AC
  Administered 2023-08-05: 1000 ug via INTRAMUSCULAR
  Filled 2023-08-05: qty 1

## 2023-08-05 MED ORDER — SODIUM CHLORIDE 0.9% FLUSH
10.0000 mL | Freq: Two times a day (BID) | INTRAVENOUS | Status: DC
  Administered 2023-08-05: 10 mL via INTRAVENOUS

## 2023-08-05 MED ORDER — IRON SUCROSE 20 MG/ML IV SOLN
200.0000 mg | Freq: Once | INTRAVENOUS | Status: AC
  Administered 2023-08-05: 200 mg via INTRAVENOUS
  Filled 2023-08-05: qty 10

## 2023-08-08 ENCOUNTER — Inpatient Hospital Stay: Payer: 59

## 2023-08-08 VITALS — BP 135/78 | HR 81 | Temp 98.3°F | Resp 18 | Wt 145.1 lb

## 2023-08-08 DIAGNOSIS — D539 Nutritional anemia, unspecified: Secondary | ICD-10-CM

## 2023-08-08 DIAGNOSIS — D509 Iron deficiency anemia, unspecified: Secondary | ICD-10-CM | POA: Diagnosis not present

## 2023-08-08 MED ORDER — IRON SUCROSE 20 MG/ML IV SOLN
200.0000 mg | Freq: Once | INTRAVENOUS | Status: AC
Start: 2023-08-08 — End: 2023-08-08
  Administered 2023-08-08: 200 mg via INTRAVENOUS
  Filled 2023-08-08: qty 10

## 2023-08-08 MED ORDER — LORATADINE 10 MG PO TABS: 10.0000 mg | ORAL_TABLET | Freq: Once | ORAL | Status: DC

## 2023-08-08 MED ORDER — ACETAMINOPHEN 325 MG PO TABS: 650.0000 mg | ORAL_TABLET | Freq: Once | ORAL | Status: DC

## 2023-08-08 MED ORDER — SODIUM CHLORIDE 0.9% FLUSH
10.0000 mL | Freq: Two times a day (BID) | INTRAVENOUS | Status: DC
  Administered 2023-08-08: 10 mL via INTRAVENOUS

## 2023-08-08 NOTE — Patient Instructions (Signed)
Iron Sucrose Injection What is this medication? IRON SUCROSE (EYE ern SOO krose) treats low levels of iron (iron deficiency anemia) in people with kidney disease. Iron is a mineral that plays an important role in making red blood cells, which carry oxygen from your lungs to the rest of your body. This medicine may be used for other purposes; ask your health care provider or pharmacist if you have questions. COMMON BRAND NAME(S): Venofer What should I tell my care team before I take this medication? They need to know if you have any of these conditions: Anemia not caused by low iron levels Heart disease High levels of iron in the blood Kidney disease Liver disease An unusual or allergic reaction to iron, other medications, foods, dyes, or preservatives Pregnant or trying to get pregnant Breastfeeding How should I use this medication? This medication is for infusion into a vein. It is given in a hospital or clinic setting. Talk to your care team about the use of this medication in children. While this medication may be prescribed for children as young as 2 years for selected conditions, precautions do apply. Overdosage: If you think you have taken too much of this medicine contact a poison control center or emergency room at once. NOTE: This medicine is only for you. Do not share this medicine with others. What if I miss a dose? Keep appointments for follow-up doses. It is important not to miss your dose. Call your care team if you are unable to keep an appointment. What may interact with this medication? Do not take this medication with any of the following: Deferoxamine Dimercaprol Other iron products This medication may also interact with the following: Chloramphenicol Deferasirox This list may not describe all possible interactions. Give your health care provider a list of all the medicines, herbs, non-prescription drugs, or dietary supplements you use. Also tell them if you smoke,  drink alcohol, or use illegal drugs. Some items may interact with your medicine. What should I watch for while using this medication? Visit your care team regularly. Tell your care team if your symptoms do not start to get better or if they get worse. You may need blood work done while you are taking this medication. You may need to follow a special diet. Talk to your care team. Foods that contain iron include: whole grains/cereals, dried fruits, beans, or peas, leafy green vegetables, and organ meats (liver, kidney). What side effects may I notice from receiving this medication? Side effects that you should report to your care team as soon as possible: Allergic reactions--skin rash, itching, hives, swelling of the face, lips, tongue, or throat Low blood pressure--dizziness, feeling faint or lightheaded, blurry vision Shortness of breath Side effects that usually do not require medical attention (report to your care team if they continue or are bothersome): Flushing Headache Joint pain Muscle pain Nausea Pain, redness, or irritation at injection site This list may not describe all possible side effects. Call your doctor for medical advice about side effects. You may report side effects to FDA at 1-800-FDA-1088. Where should I keep my medication? This medication is given in a hospital or clinic. It will not be stored at home. NOTE: This sheet is a summary. It may not cover all possible information. If you have questions about this medicine, talk to your doctor, pharmacist, or health care provider.  2024 Elsevier/Gold Standard (2023-02-11 00:00:00)

## 2023-08-10 ENCOUNTER — Inpatient Hospital Stay: Payer: 59

## 2023-08-10 VITALS — BP 110/78 | HR 51 | Temp 98.1°F | Resp 18 | Wt 147.0 lb

## 2023-08-10 DIAGNOSIS — D509 Iron deficiency anemia, unspecified: Secondary | ICD-10-CM | POA: Diagnosis not present

## 2023-08-10 DIAGNOSIS — D539 Nutritional anemia, unspecified: Secondary | ICD-10-CM

## 2023-08-10 MED ORDER — ACETAMINOPHEN 325 MG PO TABS: 650.0000 mg | ORAL_TABLET | Freq: Once | ORAL | Status: DC

## 2023-08-10 MED ORDER — SODIUM CHLORIDE 0.9% FLUSH: 10.0000 mL | Freq: Two times a day (BID) | INTRAVENOUS | Status: DC

## 2023-08-10 MED ORDER — LORATADINE 10 MG PO TABS: 10.0000 mg | ORAL_TABLET | Freq: Once | ORAL | Status: DC

## 2023-08-10 MED ORDER — IRON SUCROSE 20 MG/ML IV SOLN
200.0000 mg | Freq: Once | INTRAVENOUS | Status: AC
Start: 2023-08-10 — End: 2023-08-10
  Administered 2023-08-10: 200 mg via INTRAVENOUS
  Filled 2023-08-10: qty 10

## 2023-08-10 NOTE — Patient Instructions (Signed)
Iron Sucrose Injection What is this medication? IRON SUCROSE (EYE ern SOO krose) treats low levels of iron (iron deficiency anemia) in people with kidney disease. Iron is a mineral that plays an important role in making red blood cells, which carry oxygen from your lungs to the rest of your body. This medicine may be used for other purposes; ask your health care provider or pharmacist if you have questions. COMMON BRAND NAME(S): Venofer What should I tell my care team before I take this medication? They need to know if you have any of these conditions: Anemia not caused by low iron levels Heart disease High levels of iron in the blood Kidney disease Liver disease An unusual or allergic reaction to iron, other medications, foods, dyes, or preservatives Pregnant or trying to get pregnant Breastfeeding How should I use this medication? This medication is for infusion into a vein. It is given in a hospital or clinic setting. Talk to your care team about the use of this medication in children. While this medication may be prescribed for children as young as 2 years for selected conditions, precautions do apply. Overdosage: If you think you have taken too much of this medicine contact a poison control center or emergency room at once. NOTE: This medicine is only for you. Do not share this medicine with others. What if I miss a dose? Keep appointments for follow-up doses. It is important not to miss your dose. Call your care team if you are unable to keep an appointment. What may interact with this medication? Do not take this medication with any of the following: Deferoxamine Dimercaprol Other iron products This medication may also interact with the following: Chloramphenicol Deferasirox This list may not describe all possible interactions. Give your health care provider a list of all the medicines, herbs, non-prescription drugs, or dietary supplements you use. Also tell them if you smoke,  drink alcohol, or use illegal drugs. Some items may interact with your medicine. What should I watch for while using this medication? Visit your care team regularly. Tell your care team if your symptoms do not start to get better or if they get worse. You may need blood work done while you are taking this medication. You may need to follow a special diet. Talk to your care team. Foods that contain iron include: whole grains/cereals, dried fruits, beans, or peas, leafy green vegetables, and organ meats (liver, kidney). What side effects may I notice from receiving this medication? Side effects that you should report to your care team as soon as possible: Allergic reactions--skin rash, itching, hives, swelling of the face, lips, tongue, or throat Low blood pressure--dizziness, feeling faint or lightheaded, blurry vision Shortness of breath Side effects that usually do not require medical attention (report to your care team if they continue or are bothersome): Flushing Headache Joint pain Muscle pain Nausea Pain, redness, or irritation at injection site This list may not describe all possible side effects. Call your doctor for medical advice about side effects. You may report side effects to FDA at 1-800-FDA-1088. Where should I keep my medication? This medication is given in a hospital or clinic. It will not be stored at home. NOTE: This sheet is a summary. It may not cover all possible information. If you have questions about this medicine, talk to your doctor, pharmacist, or health care provider.  2024 Elsevier/Gold Standard (2023-02-11 00:00:00)

## 2023-08-12 ENCOUNTER — Inpatient Hospital Stay: Payer: 59

## 2023-08-12 VITALS — BP 97/52 | HR 70 | Temp 98.8°F | Resp 16 | Wt 145.0 lb

## 2023-08-12 DIAGNOSIS — D509 Iron deficiency anemia, unspecified: Secondary | ICD-10-CM | POA: Diagnosis not present

## 2023-08-12 DIAGNOSIS — D539 Nutritional anemia, unspecified: Secondary | ICD-10-CM

## 2023-08-12 MED ORDER — SODIUM CHLORIDE 0.9% FLUSH
10.0000 mL | Freq: Two times a day (BID) | INTRAVENOUS | Status: DC
  Administered 2023-08-12: 10 mL via INTRAVENOUS

## 2023-08-12 MED ORDER — ACETAMINOPHEN 325 MG PO TABS: 650.0000 mg | ORAL_TABLET | Freq: Once | ORAL | Status: DC

## 2023-08-12 MED ORDER — IRON SUCROSE 20 MG/ML IV SOLN
200.0000 mg | Freq: Once | INTRAVENOUS | Status: AC
Start: 2023-08-12 — End: 2023-08-12
  Administered 2023-08-12: 200 mg via INTRAVENOUS
  Filled 2023-08-12: qty 10

## 2023-08-12 MED ORDER — LORATADINE 10 MG PO TABS: 10.0000 mg | ORAL_TABLET | Freq: Once | ORAL | Status: DC

## 2023-08-12 NOTE — Patient Instructions (Signed)
Iron Sucrose Injection What is this medication? IRON SUCROSE (EYE ern SOO krose) treats low levels of iron (iron deficiency anemia) in people with kidney disease. Iron is a mineral that plays an important role in making red blood cells, which carry oxygen from your lungs to the rest of your body. This medicine may be used for other purposes; ask your health care provider or pharmacist if you have questions. COMMON BRAND NAME(S): Venofer What should I tell my care team before I take this medication? They need to know if you have any of these conditions: Anemia not caused by low iron levels Heart disease High levels of iron in the blood Kidney disease Liver disease An unusual or allergic reaction to iron, other medications, foods, dyes, or preservatives Pregnant or trying to get pregnant Breastfeeding How should I use this medication? This medication is for infusion into a vein. It is given in a hospital or clinic setting. Talk to your care team about the use of this medication in children. While this medication may be prescribed for children as young as 2 years for selected conditions, precautions do apply. Overdosage: If you think you have taken too much of this medicine contact a poison control center or emergency room at once. NOTE: This medicine is only for you. Do not share this medicine with others. What if I miss a dose? Keep appointments for follow-up doses. It is important not to miss your dose. Call your care team if you are unable to keep an appointment. What may interact with this medication? Do not take this medication with any of the following: Deferoxamine Dimercaprol Other iron products This medication may also interact with the following: Chloramphenicol Deferasirox This list may not describe all possible interactions. Give your health care provider a list of all the medicines, herbs, non-prescription drugs, or dietary supplements you use. Also tell them if you smoke,  drink alcohol, or use illegal drugs. Some items may interact with your medicine. What should I watch for while using this medication? Visit your care team regularly. Tell your care team if your symptoms do not start to get better or if they get worse. You may need blood work done while you are taking this medication. You may need to follow a special diet. Talk to your care team. Foods that contain iron include: whole grains/cereals, dried fruits, beans, or peas, leafy green vegetables, and organ meats (liver, kidney). What side effects may I notice from receiving this medication? Side effects that you should report to your care team as soon as possible: Allergic reactions--skin rash, itching, hives, swelling of the face, lips, tongue, or throat Low blood pressure--dizziness, feeling faint or lightheaded, blurry vision Shortness of breath Side effects that usually do not require medical attention (report to your care team if they continue or are bothersome): Flushing Headache Joint pain Muscle pain Nausea Pain, redness, or irritation at injection site This list may not describe all possible side effects. Call your doctor for medical advice about side effects. You may report side effects to FDA at 1-800-FDA-1088. Where should I keep my medication? This medication is given in a hospital or clinic. It will not be stored at home. NOTE: This sheet is a summary. It may not cover all possible information. If you have questions about this medicine, talk to your doctor, pharmacist, or health care provider.  2024 Elsevier/Gold Standard (2023-02-11 00:00:00)

## 2023-08-15 ENCOUNTER — Inpatient Hospital Stay: Payer: 59

## 2023-08-15 VITALS — BP 108/79 | HR 79 | Temp 98.2°F | Resp 16 | Wt 145.0 lb

## 2023-08-15 DIAGNOSIS — D509 Iron deficiency anemia, unspecified: Secondary | ICD-10-CM | POA: Diagnosis not present

## 2023-08-15 DIAGNOSIS — D539 Nutritional anemia, unspecified: Secondary | ICD-10-CM

## 2023-08-15 MED ORDER — SODIUM CHLORIDE 0.9% FLUSH
10.0000 mL | Freq: Two times a day (BID) | INTRAVENOUS | Status: DC
Start: 2023-08-15 — End: 2023-08-15
  Administered 2023-08-15: 10 mL via INTRAVENOUS

## 2023-08-15 MED ORDER — IRON SUCROSE 20 MG/ML IV SOLN
200.0000 mg | Freq: Once | INTRAVENOUS | Status: AC
  Administered 2023-08-15: 200 mg via INTRAVENOUS
  Filled 2023-08-15: qty 10

## 2023-08-15 MED ORDER — LORATADINE 10 MG PO TABS: 10.0000 mg | ORAL_TABLET | Freq: Once | ORAL | Status: DC

## 2023-08-15 MED ORDER — ACETAMINOPHEN 325 MG PO TABS
650.0000 mg | ORAL_TABLET | Freq: Once | ORAL | Status: DC
Start: 2023-08-15 — End: 2023-08-15

## 2023-08-15 NOTE — Patient Instructions (Signed)
Iron Sucrose Injection What is this medication? IRON SUCROSE (EYE ern SOO krose) treats low levels of iron (iron deficiency anemia) in people with kidney disease. Iron is a mineral that plays an important role in making red blood cells, which carry oxygen from your lungs to the rest of your body. This medicine may be used for other purposes; ask your health care provider or pharmacist if you have questions. COMMON BRAND NAME(S): Venofer What should I tell my care team before I take this medication? They need to know if you have any of these conditions: Anemia not caused by low iron levels Heart disease High levels of iron in the blood Kidney disease Liver disease An unusual or allergic reaction to iron, other medications, foods, dyes, or preservatives Pregnant or trying to get pregnant Breastfeeding How should I use this medication? This medication is for infusion into a vein. It is given in a hospital or clinic setting. Talk to your care team about the use of this medication in children. While this medication may be prescribed for children as young as 2 years for selected conditions, precautions do apply. Overdosage: If you think you have taken too much of this medicine contact a poison control center or emergency room at once. NOTE: This medicine is only for you. Do not share this medicine with others. What if I miss a dose? Keep appointments for follow-up doses. It is important not to miss your dose. Call your care team if you are unable to keep an appointment. What may interact with this medication? Do not take this medication with any of the following: Deferoxamine Dimercaprol Other iron products This medication may also interact with the following: Chloramphenicol Deferasirox This list may not describe all possible interactions. Give your health care provider a list of all the medicines, herbs, non-prescription drugs, or dietary supplements you use. Also tell them if you smoke,  drink alcohol, or use illegal drugs. Some items may interact with your medicine. What should I watch for while using this medication? Visit your care team regularly. Tell your care team if your symptoms do not start to get better or if they get worse. You may need blood work done while you are taking this medication. You may need to follow a special diet. Talk to your care team. Foods that contain iron include: whole grains/cereals, dried fruits, beans, or peas, leafy green vegetables, and organ meats (liver, kidney). What side effects may I notice from receiving this medication? Side effects that you should report to your care team as soon as possible: Allergic reactions--skin rash, itching, hives, swelling of the face, lips, tongue, or throat Low blood pressure--dizziness, feeling faint or lightheaded, blurry vision Shortness of breath Side effects that usually do not require medical attention (report to your care team if they continue or are bothersome): Flushing Headache Joint pain Muscle pain Nausea Pain, redness, or irritation at injection site This list may not describe all possible side effects. Call your doctor for medical advice about side effects. You may report side effects to FDA at 1-800-FDA-1088. Where should I keep my medication? This medication is given in a hospital or clinic. It will not be stored at home. NOTE: This sheet is a summary. It may not cover all possible information. If you have questions about this medicine, talk to your doctor, pharmacist, or health care provider.  2024 Elsevier/Gold Standard (2023-02-11 00:00:00)

## 2023-08-31 ENCOUNTER — Inpatient Hospital Stay: Payer: 59

## 2023-08-31 ENCOUNTER — Inpatient Hospital Stay: Payer: 59 | Attending: Oncology

## 2023-08-31 VITALS — BP 109/77 | HR 74 | Temp 98.6°F | Resp 18 | Ht 63.0 in | Wt 146.8 lb

## 2023-08-31 DIAGNOSIS — D509 Iron deficiency anemia, unspecified: Secondary | ICD-10-CM | POA: Insufficient documentation

## 2023-08-31 DIAGNOSIS — K912 Postsurgical malabsorption, not elsewhere classified: Secondary | ICD-10-CM | POA: Diagnosis present

## 2023-08-31 DIAGNOSIS — D539 Nutritional anemia, unspecified: Secondary | ICD-10-CM

## 2023-08-31 DIAGNOSIS — E538 Deficiency of other specified B group vitamins: Secondary | ICD-10-CM | POA: Diagnosis present

## 2023-08-31 MED ORDER — CYANOCOBALAMIN 1000 MCG/ML IJ SOLN
1000.0000 ug | Freq: Once | INTRAMUSCULAR | Status: AC
  Administered 2023-08-31: 1000 ug via INTRAMUSCULAR
  Filled 2023-08-31: qty 1

## 2023-08-31 NOTE — Patient Instructions (Signed)
 Vitamin B12 Injection What is this medication? Vitamin B12 (VAHY tuh min B12) prevents and treats low vitamin B12 levels in your body. It is used in people who do not get enough vitamin B12 from their diet or when their digestive tract does not absorb enough. Vitamin B12 plays an important role in maintaining the health of your nervous system and red blood cells. This medicine may be used for other purposes; ask your health care provider or pharmacist if you have questions. COMMON BRAND NAME(S): B-12 Compliance Kit, B-12 Injection Kit, Cyomin, Dodex, LA-12, Nutri-Twelve, Physicians EZ Use B-12, Primabalt, Vitamin Deficiency Injectable System - B12 What should I tell my care team before I take this medication? They need to know if you have any of these conditions: Kidney disease Leber's disease Megaloblastic anemia An unusual or allergic reaction to cyanocobalamin, cobalt, other medications, foods, dyes, or preservatives Pregnant or trying to get pregnant Breast-feeding How should I use this medication? This medication is injected into a muscle or deeply under the skin. It is usually given in a clinic or care team's office. However, your care team may teach you how to inject yourself. Follow all instructions. Talk to your care team about the use of this medication in children. Special care may be needed. Overdosage: If you think you have taken too much of this medicine contact a poison control center or emergency room at once. NOTE: This medicine is only for you. Do not share this medicine with others. What if I miss a dose? If you are given your dose at a clinic or care team's office, call to reschedule your appointment. If you give your own injections, and you miss a dose, take it as soon as you can. If it is almost time for your next dose, take only that dose. Do not take double or extra doses. What may interact with this medication? Alcohol Colchicine This list may not describe all possible  interactions. Give your health care provider a list of all the medicines, herbs, non-prescription drugs, or dietary supplements you use. Also tell them if you smoke, drink alcohol, or use illegal drugs. Some items may interact with your medicine. What should I watch for while using this medication? Visit your care team regularly. You may need blood work done while you are taking this medication. You may need to follow a special diet. Talk to your care team. Limit your alcohol intake and avoid smoking to get the best benefit. What side effects may I notice from receiving this medication? Side effects that you should report to your care team as soon as possible: Allergic reactions--skin rash, itching, hives, swelling of the face, lips, tongue, or throat Swelling of the ankles, hands, or feet Trouble breathing Side effects that usually do not require medical attention (report to your care team if they continue or are bothersome): Diarrhea This list may not describe all possible side effects. Call your doctor for medical advice about side effects. You may report side effects to FDA at 1-800-FDA-1088. Where should I keep my medication? Keep out of the reach of children. Store at room temperature between 15 and 30 degrees C (59 and 85 degrees F). Protect from light. Throw away any unused medication after the expiration date. NOTE: This sheet is a summary. It may not cover all possible information. If you have questions about this medicine, talk to your doctor, pharmacist, or health care provider.  2024 Elsevier/Gold Standard (2021-05-19 00:00:00)

## 2023-09-01 LAB — HM COLONOSCOPY

## 2023-09-27 ENCOUNTER — Inpatient Hospital Stay: Payer: 59 | Attending: Oncology | Admitting: Oncology

## 2023-09-27 ENCOUNTER — Encounter: Payer: Self-pay | Admitting: Oncology

## 2023-09-27 ENCOUNTER — Inpatient Hospital Stay: Payer: 59

## 2023-09-27 VITALS — BP 111/76 | HR 61 | Temp 97.9°F | Resp 14 | Ht 63.0 in | Wt 146.3 lb

## 2023-09-27 DIAGNOSIS — Z9884 Bariatric surgery status: Secondary | ICD-10-CM | POA: Diagnosis not present

## 2023-09-27 DIAGNOSIS — D539 Nutritional anemia, unspecified: Secondary | ICD-10-CM | POA: Diagnosis not present

## 2023-09-27 DIAGNOSIS — K909 Intestinal malabsorption, unspecified: Secondary | ICD-10-CM

## 2023-09-27 DIAGNOSIS — K912 Postsurgical malabsorption, not elsewhere classified: Secondary | ICD-10-CM | POA: Diagnosis present

## 2023-09-27 DIAGNOSIS — E538 Deficiency of other specified B group vitamins: Secondary | ICD-10-CM | POA: Diagnosis present

## 2023-09-27 DIAGNOSIS — D509 Iron deficiency anemia, unspecified: Secondary | ICD-10-CM | POA: Insufficient documentation

## 2023-09-27 LAB — CBC WITH DIFFERENTIAL/PLATELET
Abs Immature Granulocytes: 0.01 10*3/uL (ref 0.00–0.07)
Basophils Absolute: 0 10*3/uL (ref 0.0–0.1)
Basophils Relative: 1 %
Eosinophils Absolute: 0 10*3/uL (ref 0.0–0.5)
Eosinophils Relative: 1 %
HCT: 43 % (ref 36.0–46.0)
Hemoglobin: 14.5 g/dL (ref 12.0–15.0)
Immature Granulocytes: 0 %
Lymphocytes Relative: 41 %
Lymphs Abs: 1.6 10*3/uL (ref 0.7–4.0)
MCH: 30.9 pg (ref 26.0–34.0)
MCHC: 33.7 g/dL (ref 30.0–36.0)
MCV: 91.5 fL (ref 80.0–100.0)
Monocytes Absolute: 0.4 10*3/uL (ref 0.1–1.0)
Monocytes Relative: 9 %
Neutro Abs: 2 10*3/uL (ref 1.7–7.7)
Neutrophils Relative %: 48 %
Platelets: 252 10*3/uL (ref 150–400)
RBC: 4.7 MIL/uL (ref 3.87–5.11)
RDW: 13.5 % (ref 11.5–15.5)
WBC: 4 10*3/uL (ref 4.0–10.5)
nRBC: 0 % (ref 0.0–0.2)
nRBC: 0 /100{WBCs}

## 2023-09-27 LAB — FOLATE: Folate: 3.4 ng/mL — ABNORMAL LOW (ref >5.9)

## 2023-09-27 LAB — VITAMIN B12: Vitamin B-12: 406 pg/mL (ref 180–914)

## 2023-09-27 LAB — FERRITIN: Ferritin: 56 ng/mL (ref 11–307)

## 2023-09-27 NOTE — Progress Notes (Signed)
 [facility] Cancer Follow up Visit:  Patient Care Team: Amanda Lauraine DASEN, NP as PCP - General (Nurse Practitioner)  CHIEF COMPLAINTS/PURPOSE OF CONSULTATION:  HISTORY OF PRESENTING ILLNESS: Amanda [name] 33 y.o. female is here because of  anemia Medical history notable for eating disorder, polycystic ovary syndrome, gastric bypass, diabetes mellitus type 2  May 16, 2023: WBC 3.6 hemoglobin 9.0 MCV 63 platelet count 341; 54 segs 39 lymphs 7 meds CMP normal  May 30 2023:  [name] Hematology Consult Patient underwent gastric bypass in September 2020 with loss of 100 lbs.  Patient is G2 P2001.  Last period was 6 months ago and prior to this they were not heavy  Takes a bariatric MVI which contains iron .  Has never received IV iron  nor required PRBC's in the past.  Has tolerated oral iron  from a GI standpoint.  Has a normal diet.  No history of postoperative or postpartum hemorrhage requiring transfusion.  No hematochezia, melena, hemoptysis, hematuria.  No history of intra-articular or soft tissue bleeding.  No history of abnormal bleeding in family members Patient has symptoms of fatigue, pallor,  cold intolerance, DOE, decreased performance status.  Patient has pica to ice but not starch/dirt.    WBC 3.7 hemoglobin 8.6 MCV 69 lately count 346; 47 segs 40 lymphs 11 monos 1 EO 1 basophil Direct Coombs test negative haptoglobin 192 Ferritin 2 folate 3.8 Copper  63 B12 309 25 hydroxy vitamin D52.3 Vitamin A  74.5 (ULN 57.3) Vitamin E alpha 8.9 vitamin K1   0.10  June 27 2023:  Scheduled follow up for anemia  June 06, 2023 through June 15, 2023: Received 1000 mg total of Venofer  and 1000 mcg of vitamin B12  June 14 2023:  Bariatric surgery consult to evaluate RUQ pain  June 17 2023:  MRCP- No gallstones, gallbladder wall thickening, or biliary dilatation.  Hepatic parenchymal signal findings consistent with hepatic iron  deposition.  Status post Roux  type gastric bypass. Moderate burden of stool.   June 27, 2023:   Developed a bout of pancreatitis since last visit.  Ice pica and cold intolerance have resolved.  Taking a bariatric multivitamin daily.  Begin folic acid  2 mg daily  Hgb 11.6 Ferritin 37   November 15 through August 15 2023:  Received Venofer  total of 1000 mg and B12 1000 mcg  August 31 2023:  B12 1000 mcg  September 26 2022:  Scheduled follow-up for anemia. Hgb 14.5 MCV 92 Ferritin 56   Folate 3.4  Review of Systems - Oncology  MEDICAL HISTORY: Past Medical History:  Diagnosis Date   Celiac disease    Polycystic ovarian syndrome     SURGICAL HISTORY: Past Surgical History:  Procedure Laterality Date   APPENDECTOMY     CESAREAN SECTION N/A 04/15/2016   Procedure: CESAREAN SECTION;  Surgeon: Amanda LULLA Server, MD;  Location: [facility] BIRTHING SUITES;  Service: Obstetrics;  Laterality: N/A;   foot surgery      GASTRIC BYPASS     TONSILLECTOMY      SOCIAL HISTORY: Social History   Socioeconomic History   Marital status: Married    Spouse name: Amanda [name]   Number of children: 1   Years of education: 12+2   Highest education level: Associate degree: academic program  Occupational History   Occupation: Social Worker  Tobacco Use   Smoking status: Never   Smokeless tobacco: Never  Vaping Use   Vaping status: Never Used  Substance and Sexual Activity   Alcohol use: Never  Drug use: Never   Sexual activity: Not Currently  Other Topics Concern   Not on file  Social History Narrative   Not on file   Social Drivers of Health   Financial Resource Strain: Not on file  Food Insecurity: No Food Insecurity (05/30/2023)   Hunger Vital Sign    Worried About Running Out of Food in the Last Year: Never true    Ran Out of Food in the Last Year: Never true  Transportation Needs: No Transportation Needs (05/30/2023)   PRAPARE - Administrator, Civil Service (Medical): No    Lack of Transportation  (Non-Medical): No  Physical Activity: Not on file  Stress: Not on file  Social Connections: Not on file  Intimate Partner Violence: Not At Risk (05/30/2023)   Humiliation, Afraid, Rape, and Kick questionnaire    Fear of Current or Ex-Partner: No    Emotionally Abused: No    Physically Abused: No    Sexually Abused: No    FAMILY HISTORY No family history on file.  ALLERGIES:  is allergic to nsaids and gluten meal.  MEDICATIONS:  Current Outpatient Medications  Medication Sig Dispense Refill   buPROPion (WELLBUTRIN XL) 150 MG 24 hr tablet Take 150 mg by mouth every evening.     buPROPion (WELLBUTRIN XL) 300 MG 24 hr tablet Take 300 mg by mouth every morning.     calcium  citrate (CALCITRATE - DOSED IN MG ELEMENTAL CALCIUM ) 950 (200 Ca) MG tablet Take 200 mg of elemental calcium  by mouth daily.     folic acid  (FOLVITE ) 1 MG tablet Take 1 tablet (1 mg total) by mouth daily.     Multiple Vitamin (MULTIVITAMIN) tablet Take 1 tablet by mouth daily. Bariatric MVI     testosterone cypionate (DEPOTESTOSTERONE CYPIONATE) 200 MG/ML injection Inject into the skin.     No current facility-administered medications for this visit.    PHYSICAL EXAMINATION:  ECOG PERFORMANCE STATUS: 1 - Symptomatic but completely ambulatory   There were no vitals filed for this visit.   There were no vitals filed for this visit.    Physical Exam Vitals and nursing note reviewed.  Constitutional:      General: She is not in acute distress.    Appearance: Normal appearance. She is normal weight. She is not ill-appearing, toxic-appearing or diaphoretic.  HENT:     Head: Normocephalic and atraumatic.     Right Ear: External ear normal.     Left Ear: External ear normal.     Nose: Nose normal. No congestion or rhinorrhea.  Eyes:     General: No scleral icterus.    Extraocular Movements: Extraocular movements intact.     Conjunctiva/sclera: Conjunctivae normal.     Pupils: Pupils are equal, round, and  reactive to light.  Cardiovascular:     Rate and Rhythm: Normal rate and regular rhythm.     Heart sounds: No murmur heard.    No friction rub. No gallop.  Pulmonary:     Effort: Pulmonary effort is normal. No respiratory distress.     Breath sounds: Normal breath sounds. No wheezing or rales.  Abdominal:     General: Bowel sounds are normal.     Palpations: Abdomen is soft.     Tenderness: There is no abdominal tenderness. There is no guarding or rebound.  Musculoskeletal:        General: No swelling, tenderness or deformity.     Cervical back: Normal range of motion and neck supple.  No rigidity or tenderness.     Right lower leg: No edema.  Lymphadenopathy:     Head:     Right side of head: No submental, submandibular, tonsillar, preauricular, posterior auricular or occipital adenopathy.     Left side of head: No submental, submandibular, tonsillar, preauricular, posterior auricular or occipital adenopathy.     Cervical: No cervical adenopathy.     Right cervical: No superficial, deep or posterior cervical adenopathy.    Left cervical: No superficial, deep or posterior cervical adenopathy.     Upper Body:     Right upper body: No supraclavicular, axillary, pectoral or epitrochlear adenopathy.     Left upper body: No supraclavicular, axillary, pectoral or epitrochlear adenopathy.  Skin:    General: Skin is warm and dry.     Coloration: Skin is not jaundiced.  Neurological:     General: No focal deficit present.     Mental Status: She is alert and oriented to person, place, and time.     Cranial Nerves: No cranial nerve deficit.  Psychiatric:        Behavior: Behavior normal.        Thought Content: Thought content normal.        Judgment: Judgment normal.     Comments: Affect depressed     LABORATORY DATA: I have personally reviewed the data as listed:  No visits with results within 1 Month(s) from this visit.  Latest known visit with results is:  Appointment on  06/27/2023  Component Date Value Ref Range Status   Ferritin 06/27/2023 37  11 - 307 ng/mL Final   Performed at [facility], [address] [place], [place] [address]   WBC 06/27/2023 4.6  4.0 - 10.5 K/uL Final   RBC 06/27/2023 4.76  3.87 - 5.11 MIL/uL Final   Hemoglobin 06/27/2023 11.6 (L)  12.0 - 15.0 g/dL Final   HCT 89/92/7975 37.9  36.0 - 46.0 % Final   MCV 06/27/2023 79.6 (L)  80.0 - 100.0 fL Final   MCH 06/27/2023 24.4 (L)  26.0 - 34.0 pg Final   MCHC 06/27/2023 30.6  30.0 - 36.0 g/dL Final   RDW 89/92/7975 Not Measured  11.5 - 15.5 % Final   Platelets 06/27/2023 309  150 - 400 K/uL Final   nRBC 06/27/2023 0.0  0.0 - 0.2 % Final   Neutrophils Relative % 06/27/2023 51  % Final   Neutro Abs 06/27/2023 2.4  1.7 - 7.7 K/uL Final   Lymphocytes Relative 06/27/2023 33  % Final   Lymphs Abs 06/27/2023 1.5  0.7 - 4.0 K/uL Final   Monocytes Relative 06/27/2023 13  % Final   Monocytes Absolute 06/27/2023 0.6  0.1 - 1.0 K/uL Final   Eosinophils Relative 06/27/2023 2  % Final   Eosinophils Absolute 06/27/2023 0.1  0.0 - 0.5 K/uL Final   Basophils Relative 06/27/2023 1  % Final   Basophils Absolute 06/27/2023 0.0  0.0 - 0.1 K/uL Final   Immature Granulocytes 06/27/2023 0  % Final   Abs Immature Granulocytes 06/27/2023 0.01  0.00 - 0.07 K/uL Final   Target Cells 06/27/2023 PRESENT   Final   Ovalocytes 06/27/2023 PRESENT   Final   Performed at [facility], [address] [place], [place] [address]    RADIOGRAPHIC STUDIES: I have personally reviewed the radiological images as listed and agree with the findings in the report  No results found.  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    Roux-en Y:  Weight loss is achieved by combining two techniques:  food restriction (partial gastrectomy) and malabsorption (diversion)  Surgical malabsorption:   A consequence of bariatric surgery.  Oral supplementation with the standard multivitamin preparation is often inadequate to manage.  More common following malabsorptive bypass procedures but may also occur following restrictive procedures as well.   Multiple micronutrient deficiencies occur, including the major hematinic factors, iron  and vitamin B12. Deficiencies of vitamins B1, A, K, D, and E are seen.   Copper  deficiency has also been reported after RYGB, which can cause hematological abnormalities with or without associated neurological complications  Anemia:  Likely a consequence of surgical malabsorption.  Incidence of anemia following gastric bypass is 12% to 30%, mostly ascribed to micronutrient including vitamin-deficiency states.  Despite oral supplementation and normal blood concentrations of iron , copper , folic acid , and vitamin B12, many patients display persistent anemia after RYGB. Hematological complications are of clinical significance since anemia often has serious comorbid consequences.  June 06, 2023 through June 15, 2023: Received 1000 mg total of Venofer  and 1000 mcg of vitamin B12   June 27 2023-  Hgb 11.6 Ferritin 37.  Will arrange for patient to receive another course of IV iron .  Continue B12 and folate replacement.    November 15 through August 15 2023:  Received Venofer  total of 1000 mg and B12 1000 mcg  August 31 2023:  B12 1000 mcg  September 26 2022:  Hgb 14.5 MCV 92 Ferritin 56   Folate 3.4.  Copper  and B12 levels pending.  Continue oral replacement.  Will confirm that patient is consistently taking folate  To begin sublingual B12  RUQ abdominal pain June 27 2023- Likely due to gallstone pancreatitis.  Patient at risk for gallstones due to significant weight loss.  Absence of gallstones on imaging may indicate that patient passed them by the time she presented with pain.  Lipid panel in September 2024 showed normal triglycerides.      Cancer Staging  No  matching staging information was found for the patient.    No problem-specific Assessment & Plan notes found for this encounter.    No orders of the defined types were placed in this encounter.   40  minutes was spent in patient care.  This included time spent preparing to see the patient (e.g., review of tests), obtaining and/or reviewing separately obtained history, counseling and educating the patient/family/caregiver, ordering medications, tests, or procedures; documenting clinical information in the electronic or other health record, independently interpreting results and communicating results to the patient/family/caregiver as well as coordination of care.       All questions were answered. The patient knows to call the clinic with any problems, questions or concerns.  This note was electronically signed.    Guillermina JAYSON Perla, MD  09/27/2023 9:41 AM

## 2023-09-27 NOTE — Patient Instructions (Signed)
 Please begin Vitamin B12 1000 micrograms under the tongue (sublingual) daily

## 2023-09-28 LAB — COPPER, SERUM: Copper: 87 ug/dL (ref 80–158)

## 2023-09-30 ENCOUNTER — Encounter: Payer: Self-pay | Admitting: Oncology

## 2023-10-20 ENCOUNTER — Ambulatory Visit (HOSPITAL_BASED_OUTPATIENT_CLINIC_OR_DEPARTMENT_OTHER)
Admission: EM | Admit: 2023-10-20 | Discharge: 2023-10-20 | Disposition: A | Discharge status: Home / Self Care | Payer: 59

## 2023-10-20 ENCOUNTER — Encounter (HOSPITAL_BASED_OUTPATIENT_CLINIC_OR_DEPARTMENT_OTHER): Payer: Self-pay

## 2023-10-20 DIAGNOSIS — J208 Acute bronchitis due to other specified organisms: Secondary | ICD-10-CM

## 2023-10-20 DIAGNOSIS — J0140 Acute pansinusitis, unspecified: Secondary | ICD-10-CM

## 2023-10-20 MED ORDER — IPRATROPIUM-ALBUTEROL 0.5-2.5 (3) MG/3ML IN SOLN
3.0000 mL | Freq: Once | RESPIRATORY_TRACT | Status: AC
  Administered 2023-10-20: 3 mL via RESPIRATORY_TRACT

## 2023-10-20 MED ORDER — ALBUTEROL SULFATE HFA 108 (90 BASE) MCG/ACT IN AERS
2.0000 | INHALATION_SPRAY | Freq: Once | RESPIRATORY_TRACT | Status: AC
  Administered 2023-10-20: 2 via RESPIRATORY_TRACT

## 2023-10-20 MED ORDER — PREDNISONE 20 MG PO TABS: 20.0000 mg | ORAL_TABLET | Freq: Every day | ORAL | 0 refills | Status: AC

## 2023-10-20 MED ORDER — CEFDINIR 300 MG PO CAPS: 300.0000 mg | ORAL_CAPSULE | Freq: Two times a day (BID) | ORAL | 0 refills | Status: AC

## 2023-10-20 MED ORDER — AEROCHAMBER PLUS FLO-VU MEDIUM MISC
1.0000 | Freq: Once | Status: AC
  Administered 2023-10-20: 1

## 2023-10-20 NOTE — ED Triage Notes (Signed)
States had upper respiratory symptoms 2 weeks ago. Those symptoms were fever, sinus drainage, chest congestion. Reports cough persists and concerned she may have pneumonia.

## 2023-10-20 NOTE — ED Provider Notes (Signed)
[facility]    CSN: [id number] Arrival date & time: 10/20/23  1942      History   Chief Complaint Chief Complaint  Patient presents with   Cough    HPI Amanda [name] is a 33 y.o. female.   States had upper respiratory symptoms 2 weeks ago. Those symptoms were fever, sinus drainage, chest congestion. Reports cough persists and concerned she may have pneumonia.  She also has facial pain and even some dental pain.  She is wheezing at times.   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   Malabsorption 05/30/2023   Morbid (severe) obesity due to excess calories (HCC) 01/01/2016    Past Surgical History:  Procedure Laterality Date   APPENDECTOMY     CESAREAN SECTION N/A 04/15/2016   Procedure: CESAREAN SECTION;  Surgeon: Tilda Burrow, MD;  Location: [facility] BIRTHING SUITES;  Service: Obstetrics;  Laterality: N/A;   foot surgery      GASTRIC BYPASS     TONSILLECTOMY      OB History     Gravida  3   Para  1   Term  0   Preterm  1   AB  2   Living  1      SAB  2   IAB  0   Ectopic  0   Multiple  0   Live Births  1            Home Medications    Prior to Admission medications   Medication Sig Start Date End Date Taking? Authorizing Provider  cefdinir (OMNICEF) 300 MG capsule Take 1 capsule (300 mg total) by mouth 2 (two) times daily after a meal for 7 days. 10/20/23 10/27/23 Yes Prescilla Sours, FNP  predniSONE (DELTASONE) 20 MG tablet Take 1 tablet (20 mg total) by mouth daily with breakfast for 5 days. 10/20/23 10/25/23 Yes  Prescilla Sours, FNP  tirzepatide [facility]) 5 MG/0.5ML Pen Inject 5 mg into the skin once a week.   Yes [provider]  buPROPion (WELLBUTRIN XL) 150 MG 24 hr tablet Take 150 mg by mouth every evening. 05/05/23   [provider]  buPROPion (WELLBUTRIN XL) 300 MG 24 hr tablet Take 300 mg by mouth every morning. 04/19/23   [provider]  calcium citrate (CALCITRATE - DOSED IN MG ELEMENTAL CALCIUM) 950 (200 Ca) MG tablet Take 200 mg of elemental calcium by mouth daily.    [provider]  folic acid (FOLVITE) 1 MG tablet Take 1 tablet (1 mg total) by mouth daily. 06/01/23   Loni Muse, MD  Multiple Vitamin (MULTIVITAMIN) tablet Take 1 tablet by mouth daily. Bariatric MVI    [provider]  testosterone cypionate (DEPOTESTOSTERONE CYPIONATE) 200 MG/ML injection Inject into the skin. 05/11/23   [provider]    Family History History reviewed. No pertinent family history.  Social History Social History   Tobacco Use   Smoking status: Never   Smokeless tobacco: Never  Vaping Use   Vaping status:  Never Used  Substance Use Topics   Alcohol use: Never   Drug use: Never     Allergies   Nsaids and Gluten meal   Review of Systems Review of Systems  Constitutional:  Negative for chills and fever.  HENT:  Positive for congestion, postnasal drip, rhinorrhea, sinus pressure and sinus pain. Negative for ear pain and sore throat.   Eyes:  Negative for pain and visual disturbance.  Respiratory:  Positive for cough and wheezing. Negative for shortness of breath.   Cardiovascular:  Negative for chest pain and palpitations.  Gastrointestinal:  Negative for abdominal pain and vomiting.  Genitourinary:  Negative for dysuria and hematuria.  Musculoskeletal:  Negative for arthralgias and back pain.  Skin:  Negative for color change and rash.  Neurological:  Negative for seizures and syncope.  All other systems reviewed and are  negative.    Physical Exam Triage Vital Signs ED Triage Vitals  Encounter Vitals Group     BP 10/20/23 2002 128/85     Systolic BP Percentile --      Diastolic BP Percentile --      Pulse Rate 10/20/23 2002 76     Resp 10/20/23 2002 20     Temp 10/20/23 2002 98.7 F (37.1 C)     Temp Source 10/20/23 2002 Oral     SpO2 10/20/23 2002 98 %     Weight 10/20/23 2003 140 lb (63.5 kg)     Height --      Head Circumference --      Peak Flow --      Pain Score 10/20/23 2003 0     Pain Loc --      Pain Education --      Exclude from Growth Chart --    No data found.  Updated Vital Signs BP 128/85 (BP Location: Right Arm)   Pulse 76   Temp 98.7 F (37.1 C) (Oral)   Resp 20   Wt 140 lb (63.5 kg)   LMP 10/11/2023 (Exact Date)   SpO2 98%   BMI 24.80 kg/m   Visual Acuity Right Eye Distance:   Left Eye Distance:   Bilateral Distance:    Right Eye Near:   Left Eye Near:    Bilateral Near:     Physical Exam Vitals and nursing note reviewed.  Constitutional:      General: She is not in acute distress.    Appearance: She is well-developed. She is ill-appearing. She is not toxic-appearing.  HENT:     Head: Normocephalic and atraumatic.     Right Ear: Hearing, tympanic membrane, ear canal and external ear normal.     Left Ear: Hearing, tympanic membrane, ear canal and external ear normal.     Nose: Congestion and rhinorrhea present. Rhinorrhea is purulent.     Right Sinus: Maxillary sinus tenderness and frontal sinus tenderness present.     Left Sinus: Maxillary sinus tenderness and frontal sinus tenderness present.     Mouth/Throat:     Lips: Pink.     Mouth: Mucous membranes are moist.     Pharynx: Uvula midline. No oropharyngeal exudate or posterior oropharyngeal erythema.     Tonsils: No tonsillar exudate.  Eyes:     Conjunctiva/sclera: Conjunctivae normal.     Pupils: Pupils are equal, round, and reactive to light.  Cardiovascular:     Rate and Rhythm: Normal rate  and regular rhythm.     Heart sounds: S1 normal and S2 normal. No  murmur heard. Pulmonary:     Effort: Pulmonary effort is normal. No respiratory distress.     Breath sounds: Examination of the right-upper field reveals wheezing and rhonchi. Examination of the left-upper field reveals wheezing and rhonchi. Examination of the right-middle field reveals wheezing and rhonchi. Examination of the left-middle field reveals wheezing and rhonchi. Examination of the right-lower field reveals wheezing and rhonchi. Examination of the left-lower field reveals wheezing and rhonchi. Wheezing (Moderate) and rhonchi present. No decreased breath sounds or rales.     Comments: She has rhonchi and wheezing but she is not in distress and her O2 sat was 98% on room air.  Reassessment after DuoNeb treatment: Continues with some rhonchi and wheezing, but significant improvement in breath sounds after nebulizer treatment. Abdominal:     Palpations: Abdomen is soft.     Tenderness: There is no abdominal tenderness.  Musculoskeletal:        General: No swelling.     Cervical back: Neck supple.  Lymphadenopathy:     Head:     Right side of head: No submental, submandibular, tonsillar, preauricular or posterior auricular adenopathy.     Left side of head: No submental, submandibular, tonsillar, preauricular or posterior auricular adenopathy.     Cervical: Cervical adenopathy present.     Right cervical: No superficial cervical adenopathy.    Left cervical: Superficial cervical adenopathy present.  Skin:    General: Skin is warm and dry.     Capillary Refill: Capillary refill takes less than 2 seconds.     Findings: No rash.  Neurological:     Mental Status: She is alert and oriented to person, place, and time.  Psychiatric:        Mood and Affect: Mood normal.      UC Treatments / Results  Labs (all labs ordered are listed, but only abnormal results are displayed) Labs Reviewed - No data to  display  EKG   Radiology No results found.  Procedures Procedures (including critical care time)  Medications Ordered in UC Medications  ipratropium-albuterol (DUONEB) 0.5-2.5 (3) MG/3ML nebulizer solution 3 mL (3 mLs Nebulization Given 10/20/23 2045)  albuterol (VENTOLIN HFA) 108 (90 Base) MCG/ACT inhaler 2 puff (2 puffs Inhalation Given 10/20/23 2045)  AeroChamber Plus Flo-Vu Medium MISC 1 each (1 each Other Given 10/20/23 2045)    Initial Impression / Assessment and Plan / UC Course  I have reviewed the triage vital signs and the nursing notes.  Pertinent labs & imaging results that were available during my care of the patient were reviewed by me and considered in my medical decision making (see chart for details).  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  Breath sounds throughout and oxygen saturation on room air is 98%.  I do not think she has pneumonia but she definitely has bronchitis and a believe she also has sinusitis.  Will treat with cefdinir, 300 mg, twice daily for 7 days.  Prednisone, 20 mg, 1 daily for 5 days.  Albuterol inhaler with spacer, 2 puffs, every 4 hours, as needed for wheezing.  Get plenty of fluids and rest.  Encouraged to follow-up here or with primary care in 10 to 14 days for general recheck.  Follow-up here if symptoms do not improve, worsen or new symptoms occur. Final Clinical Impressions(s) / UC Diagnoses   Final diagnoses:  Acute non-recurrent pansinusitis  Acute bronchitis, viral     Discharge Instructions      Sinusitis: Cefdinir,  300 mg, twice daily for 7 days.  Sinusitis and bronchitis: Prednisone, 20 mg, 1, daily for 5 days.  Albuterol inhaler with spacer, 2 puffs, every 4 hours, as needed for wheezing.  Get plenty of fluids and rest.  Follow-up if symptoms do not improve, worsen or new symptoms occur.  May need blood work or a chest x-ray for further workup at that time.     ED Prescriptions      Medication Sig Dispense Auth. Provider   cefdinir (OMNICEF) 300 MG capsule Take 1 capsule (300 mg total) by mouth 2 (two) times daily after a meal for 7 days. 14 capsule Prescilla Sours, FNP   predniSONE (DELTASONE) 20 MG tablet Take 1 tablet (20 mg total) by mouth daily with breakfast for 5 days. 5 tablet Prescilla Sours, FNP      PDMP not reviewed this encounter.   Prescilla Sours, FNP 10/20/23 2106

## 2023-10-20 NOTE — Discharge Instructions (Addendum)
Sinusitis: Cefdinir, 300 mg, twice daily for 7 days.  Sinusitis and bronchitis: Prednisone, 20 mg, 1, daily for 5 days.  Albuterol inhaler with spacer, 2 puffs, every 4 hours, as needed for wheezing.  Get plenty of fluids and rest.  Follow-up if symptoms do not improve, worsen or new symptoms occur.  May need blood work or a chest x-ray for further workup at that time.

## 2023-10-23 ENCOUNTER — Other Ambulatory Visit: Payer: Self-pay | Admitting: Pharmacist

## 2024-01-26 ENCOUNTER — Other Ambulatory Visit: Payer: Self-pay | Admitting: Hematology and Oncology

## 2024-01-26 DIAGNOSIS — D539 Nutritional anemia, unspecified: Secondary | ICD-10-CM

## 2024-01-27 DIAGNOSIS — D508 Other iron deficiency anemias: Secondary | ICD-10-CM | POA: Insufficient documentation

## 2024-01-27 DIAGNOSIS — K9589 Other complications of other bariatric procedure: Secondary | ICD-10-CM | POA: Insufficient documentation

## 2024-01-27 NOTE — Assessment & Plan Note (Signed)
 Iron  deficiency anemia felt to be due to decreased absorption after gastric bypass surgery.  She received IV Venofer  in September and November 2024 with a good response.

## 2024-01-27 NOTE — Progress Notes (Signed)
 [facility]  [address] [place],  [place]  [address] [phone]  Clinic Day:  01/30/2024  Referring physician: Trudi Fus, NP  ASSESSMENT & PLAN:   Assessment & Plan: Iron  deficiency anemia following bariatric surgery Iron  deficiency anemia felt to be due to decreased absorption after gastric bypass surgery.  She received IV Venofer  in September and November 2024 with a good response.  EGD and colonoscopy in December 2024 were negative.  She continues an iron  supplement daily.  I will plan to see her back in 6 months with a CBC, iron  panel and ferritin.  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  She continues folic acid  1 mg daily.  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  She continues copper  supplement daily.    The patient understands the plans discussed today and is in agreement with them.  She knows to contact our office if she develops concerns prior to her next appointment.   I provided 20 minutes of face-to-face time during this encounter and > 50% was spent counseling as documented under my assessment and plan.    Brien Lowe A Javarious Elsayed, PA-C  [facility] [place] - A DEPT OF [facility]. [facility] [address] [place] [address] Dept: [phone] Dept Fax: [phone]   No orders of the defined types were placed in this encounter.     CHIEF COMPLAINT:  CC: Iron  deficiency anemia  Current Treatment: IV iron  as needed  HISTORY OF PRESENT ILLNESS:  Amanda [name] is a 33 year old with iron  deficiency anemia due to decreased absorption after gastric bypass who we began seeing in September 2024.  She was also found to have folate and copper  deficiency.  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  She had been taking a bariatric multivitamin.  She was treated with IV iron   in the form Venofer  in September and again in November 2024 with a good response.  She was also found to have mild folate and copper  deficiency, so was placed on folic acid  and copper .  She saw Dr. Monico Anna and had EGD and colonoscopy in December 2024 which did not reveal any source of bleeding or other abnormality.  She continues a bariatric multivitamin, as well as a separate iron , copper , and folic acid  supplement.   INTERVAL HISTORY:  Amanda [name] is here today for repeat clinical assessment.  She denies progressive fatigue concerning for recurrent anemia.  She states she has regular menses about every 4 weeks usually lasting 3 days with 1 day heavy.  She otherwise denies any overt form of blood loss.  She asks if it is normal to feel lymph nodes in the groin.  She denies fevers or chills. She denies pain. Her appetite is good. Her weight has increased 12 pounds over last 3 months.  She states she is up to date on pelvic examination and Pap smear.  REVIEW OF SYSTEMS:  Review of Systems  Constitutional:  Negative for appetite change, chills, fatigue, fever and unexpected weight change.  HENT:   Negative for lump/mass, mouth sores and sore throat.   Respiratory:  Negative for cough and shortness of breath.   Cardiovascular:  Negative for chest pain and leg swelling.  Gastrointestinal:  Negative for abdominal pain, blood in stool, constipation, diarrhea, nausea and vomiting.  Genitourinary:  Negative for difficulty urinating, dysuria, frequency, hematuria, menstrual problem and vaginal  discharge.   Musculoskeletal:  Negative for arthralgias, back pain and myalgias.  Skin:  Negative for rash.  Neurological:  Negative for dizziness and headaches.  Hematological:  Negative for adenopathy. Does not bruise/bleed easily.  Psychiatric/Behavioral:  Negative for depression and sleep disturbance. The patient is not nervous/anxious.      VITALS:  Blood pressure 104/80, pulse 71, temperature 99.2 F (37.3  C), temperature source Oral, resp. rate 20, height 5\' 3"  (1.6 m), weight 152 lb 9.6 oz (69.2 kg), last menstrual period 01/05/2024, SpO2 99%.  Wt Readings from Last 3 Encounters:  01/30/24 152 lb 9.6 oz (69.2 kg)  10/20/23 140 lb (63.5 kg)  09/27/23 146 lb 4.8 oz (66.4 kg)  Body mass index is 27.03 kg/m.  Performance status (ECOG): 0 - Asymptomatic   PHYSICAL EXAM:  Physical Exam Vitals and nursing note reviewed.  Constitutional:      General: She is not in acute distress.    Appearance: Normal appearance. She is normal weight. She is not ill-appearing.  HENT:     Head: Normocephalic and atraumatic.     Mouth/Throat:     Mouth: Mucous membranes are moist.     Pharynx: Oropharynx is clear. No oropharyngeal exudate or posterior oropharyngeal erythema.  Eyes:     General: No scleral icterus.    Extraocular Movements: Extraocular movements intact.     Conjunctiva/sclera: Conjunctivae normal.     Pupils: Pupils are equal, round, and reactive to light.  Cardiovascular:     Rate and Rhythm: Normal rate and regular rhythm.     Heart sounds: Normal heart sounds. No murmur heard.    No friction rub. No gallop.  Pulmonary:     Effort: Pulmonary effort is normal.     Breath sounds: Normal breath sounds. No wheezing, rhonchi or rales.  Abdominal:     General: There is no distension.     Palpations: Abdomen is soft. There is no hepatomegaly, splenomegaly or mass.     Tenderness: There is no abdominal tenderness.  Musculoskeletal:        General: Normal range of motion.     Cervical back: Normal range of motion and neck supple. No tenderness.     Right lower leg: No edema.     Left lower leg: No edema.  Lymphadenopathy:     Cervical: No cervical adenopathy.     Upper Body:     Right upper body: No supraclavicular or axillary adenopathy.     Left upper body: No supraclavicular or axillary adenopathy.     Lower Body: No right inguinal adenopathy. Left inguinal adenopathy  (subcentimeter mid groin) present.  Skin:    General: Skin is warm and dry.     Coloration: Skin is not jaundiced.     Findings: No rash.  Neurological:     Mental Status: She is alert and oriented to person, place, and time.     Cranial Nerves: No cranial nerve deficit.  Psychiatric:        Mood and Affect: Mood normal.        Behavior: Behavior normal.        Thought Content: Thought content normal.     LABS:      Latest Ref Rng & Units 01/30/2024    8:30 AM 09/27/2023   10:22 AM 06/27/2023   10:04 AM  CBC  WBC 4.0 - 10.5 K/uL 4.6  4.0  4.6   Hemoglobin 12.0 - 15.0 g/dL 16.1  09.6  11.6  Hematocrit 36.0 - 46.0 % 45.1  43.0  37.9   Platelets 150 - 400 K/uL 256  252  309       Latest Ref Rng & Units 03/25/2016   12:21 PM  CMP  Glucose 65 - 99 mg/dL 308   BUN 6 - 20 mg/dL <5   Creatinine 6.57 - 1.00 mg/dL 8.46   Sodium 962 - 952 mmol/L 135   Potassium 3.5 - 5.1 mmol/L 3.8   Chloride 101 - 111 mmol/L 107   CO2 22 - 32 mmol/L 21   Calcium  8.9 - 10.3 mg/dL 9.2   Total Protein 6.5 - 8.1 g/dL 6.6   Total Bilirubin 0.3 - 1.2 mg/dL 0.3   Alkaline Phos 38 - 126 U/L 57   AST 15 - 41 U/L 15   ALT 14 - 54 U/L 13     Lab Results  Component Value Date   TIBC 386 01/30/2024   FERRITIN 16 01/30/2024   FERRITIN 56 09/27/2023   FERRITIN 37 06/27/2023   IRONPCTSAT 22 01/30/2024    STUDIES:  No results found.    HISTORY:   Past Medical History:  Diagnosis Date   Celiac disease    Polycystic ovarian syndrome     Past Surgical History:  Procedure Laterality Date   APPENDECTOMY     CESAREAN SECTION N/A 04/15/2016   Procedure: CESAREAN SECTION;  Surgeon: Albino Hum, MD;  Location: [facility] BIRTHING SUITES;  Service: Obstetrics;  Laterality: N/A;   foot surgery      GASTRIC BYPASS     TONSILLECTOMY      History reviewed. No pertinent family history.  Social History:  reports that she has never smoked. She has never used smokeless tobacco. She reports that she does not  drink alcohol and does not use drugs.The patient is alone today.  Allergies:  Allergies  Allergen Reactions   Nsaids Other (See Comments)    S/P gastric bypass   Gluten Meal Diarrhea and Other (See Comments)    Celiac disease    Current Medications: Current Outpatient Medications  Medication Sig Dispense Refill   Copper  Gluconate (COPPER  CAPS PO) Take 1 tablet by mouth daily.     FERROUS FUMARATE PO Take 1 each by mouth daily. 45 mg iron      metFORMIN (GLUCOPHAGE-XR) 500 MG 24 hr tablet Take 500 mg by mouth every evening. Two tabs every evening     buPROPion (WELLBUTRIN XL) 150 MG 24 hr tablet Take 150 mg by mouth every evening.     buPROPion (WELLBUTRIN XL) 300 MG 24 hr tablet Take 300 mg by mouth every morning.     calcium  citrate (CALCITRATE - DOSED IN MG ELEMENTAL CALCIUM ) 950 (200 Ca) MG tablet Take 200 mg of elemental calcium  by mouth daily.     folic acid  (FOLVITE ) 1 MG tablet Take 1 tablet (1 mg total) by mouth daily.     Multiple Vitamin (MULTIVITAMIN) tablet Take 1 tablet by mouth daily. Bariatric MVI     testosterone cypionate (DEPOTESTOSTERONE CYPIONATE) 200 MG/ML injection Inject into the skin.     tirzepatide (MOUNJARO) 5 MG/0.5ML Pen Inject 5 mg into the skin once a week.     No current facility-administered medications for this visit.

## 2024-01-30 ENCOUNTER — Inpatient Hospital Stay: Payer: 59 | Attending: Hematology and Oncology | Admitting: Hematology and Oncology

## 2024-01-30 ENCOUNTER — Telehealth: Payer: Self-pay | Admitting: Hematology and Oncology

## 2024-01-30 ENCOUNTER — Encounter: Payer: Self-pay | Admitting: Hematology and Oncology

## 2024-01-30 ENCOUNTER — Inpatient Hospital Stay: Payer: 59

## 2024-01-30 VITALS — BP 104/80 | HR 71 | Temp 99.2°F | Resp 20 | Ht 63.0 in | Wt 152.6 lb

## 2024-01-30 DIAGNOSIS — D508 Other iron deficiency anemias: Secondary | ICD-10-CM | POA: Diagnosis not present

## 2024-01-30 DIAGNOSIS — K912 Postsurgical malabsorption, not elsewhere classified: Secondary | ICD-10-CM | POA: Diagnosis not present

## 2024-01-30 DIAGNOSIS — E538 Deficiency of other specified B group vitamins: Secondary | ICD-10-CM | POA: Insufficient documentation

## 2024-01-30 DIAGNOSIS — Z9884 Bariatric surgery status: Secondary | ICD-10-CM | POA: Insufficient documentation

## 2024-01-30 DIAGNOSIS — D539 Nutritional anemia, unspecified: Secondary | ICD-10-CM

## 2024-01-30 DIAGNOSIS — K9589 Other complications of other bariatric procedure: Secondary | ICD-10-CM | POA: Diagnosis not present

## 2024-01-30 DIAGNOSIS — E610 Copper deficiency: Secondary | ICD-10-CM | POA: Diagnosis not present

## 2024-01-30 DIAGNOSIS — D509 Iron deficiency anemia, unspecified: Secondary | ICD-10-CM | POA: Insufficient documentation

## 2024-01-30 LAB — CBC WITH DIFFERENTIAL (CANCER CENTER ONLY)
Abs Immature Granulocytes: 0.02 10*3/uL (ref 0.00–0.07)
Basophils Absolute: 0 10*3/uL (ref 0.0–0.1)
Basophils Relative: 0 %
Eosinophils Absolute: 0.1 10*3/uL (ref 0.0–0.5)
Eosinophils Relative: 2 %
HCT: 45.1 % (ref 36.0–46.0)
Hemoglobin: 14.6 g/dL (ref 12.0–15.0)
Immature Granulocytes: 0 %
Lymphocytes Relative: 38 %
Lymphs Abs: 1.7 10*3/uL (ref 0.7–4.0)
MCH: 31.5 pg (ref 26.0–34.0)
MCHC: 32.4 g/dL (ref 30.0–36.0)
MCV: 97.4 fL (ref 80.0–100.0)
Monocytes Absolute: 0.6 10*3/uL (ref 0.1–1.0)
Monocytes Relative: 12 %
Neutro Abs: 2.2 10*3/uL (ref 1.7–7.7)
Neutrophils Relative %: 48 %
Platelet Count: 256 10*3/uL (ref 150–400)
RBC: 4.63 MIL/uL (ref 3.87–5.11)
RDW: 13.6 % (ref 11.5–15.5)
WBC Count: 4.6 10*3/uL (ref 4.0–10.5)
nRBC: 0 % (ref 0.0–0.2)

## 2024-01-30 LAB — VITAMIN B12: Vitamin B-12: 407 pg/mL (ref 180–914)

## 2024-01-30 LAB — IRON AND TIBC
Iron: 84 ug/dL (ref 28–170)
Saturation Ratios: 22 % (ref 10.4–31.8)
TIBC: 386 ug/dL (ref 250–450)
UIBC: 302 ug/dL

## 2024-01-30 LAB — FERRITIN: Ferritin: 16 ng/mL (ref 11–307)

## 2024-01-30 LAB — FOLATE: Folate: 7.1 ng/mL (ref >5.9)

## 2024-01-30 NOTE — Assessment & Plan Note (Signed)
 Folate deficiency felt to be due to decreased absorption after bariatric surgery.  She continues folic acid  1 mg daily.

## 2024-01-30 NOTE — Assessment & Plan Note (Signed)
 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  She continues copper  supplement daily.

## 2024-01-30 NOTE — Telephone Encounter (Signed)
 Patient has been scheduled for follow-up visit per 01/30/24 LOS.  Pt given an appt calendar with date and time.

## 2024-07-30 ENCOUNTER — Inpatient Hospital Stay: Admitting: Hematology and Oncology

## 2024-07-30 ENCOUNTER — Inpatient Hospital Stay

## 2024-08-09 ENCOUNTER — Other Ambulatory Visit: Payer: Self-pay

## 2024-08-09 ENCOUNTER — Telehealth: Payer: Self-pay | Admitting: Hematology and Oncology

## 2024-08-09 ENCOUNTER — Inpatient Hospital Stay: Admitting: Hematology and Oncology

## 2024-08-09 ENCOUNTER — Inpatient Hospital Stay: Attending: Hematology and Oncology

## 2024-08-09 ENCOUNTER — Encounter: Payer: Self-pay | Admitting: Hematology and Oncology

## 2024-08-09 VITALS — BP 112/74 | HR 73 | Temp 98.0°F | Resp 14 | Ht 63.0 in | Wt 146.5 lb

## 2024-08-09 DIAGNOSIS — Z79899 Other long term (current) drug therapy: Secondary | ICD-10-CM | POA: Diagnosis not present

## 2024-08-09 DIAGNOSIS — D509 Iron deficiency anemia, unspecified: Secondary | ICD-10-CM | POA: Insufficient documentation

## 2024-08-09 DIAGNOSIS — Z9884 Bariatric surgery status: Secondary | ICD-10-CM | POA: Insufficient documentation

## 2024-08-09 DIAGNOSIS — E282 Polycystic ovarian syndrome: Secondary | ICD-10-CM | POA: Diagnosis not present

## 2024-08-09 DIAGNOSIS — D508 Other iron deficiency anemias: Secondary | ICD-10-CM

## 2024-08-09 DIAGNOSIS — E610 Copper deficiency: Secondary | ICD-10-CM | POA: Diagnosis not present

## 2024-08-09 DIAGNOSIS — E538 Deficiency of other specified B group vitamins: Secondary | ICD-10-CM | POA: Diagnosis not present

## 2024-08-09 DIAGNOSIS — K912 Postsurgical malabsorption, not elsewhere classified: Secondary | ICD-10-CM | POA: Insufficient documentation

## 2024-08-09 LAB — CBC WITH DIFFERENTIAL (CANCER CENTER ONLY)
Abs Immature Granulocytes: 0.01 K/uL (ref 0.00–0.07)
Basophils Absolute: 0 K/uL (ref 0.0–0.1)
Basophils Relative: 1 %
Eosinophils Absolute: 0.2 K/uL (ref 0.0–0.5)
Eosinophils Relative: 4 %
HCT: 42 % (ref 36.0–46.0)
Hemoglobin: 13.8 g/dL (ref 12.0–15.0)
Immature Granulocytes: 0 %
Lymphocytes Relative: 36 %
Lymphs Abs: 1.5 K/uL (ref 0.7–4.0)
MCH: 31.6 pg (ref 26.0–34.0)
MCHC: 32.9 g/dL (ref 30.0–36.0)
MCV: 96.1 fL (ref 80.0–100.0)
Monocytes Absolute: 0.5 K/uL (ref 0.1–1.0)
Monocytes Relative: 11 %
Neutro Abs: 2.1 K/uL (ref 1.7–7.7)
Neutrophils Relative %: 48 %
Platelet Count: 233 K/uL (ref 150–400)
RBC: 4.37 MIL/uL (ref 3.87–5.11)
RDW: 12.4 % (ref 11.5–15.5)
WBC Count: 4.3 K/uL (ref 4.0–10.5)
nRBC: 0 % (ref 0.0–0.2)

## 2024-08-09 LAB — IRON AND TIBC
Iron: 106 ug/dL (ref 28–170)
Saturation Ratios: 31 % (ref 10.4–31.8)
TIBC: 343 ug/dL (ref 250–450)
UIBC: 237 ug/dL

## 2024-08-09 LAB — VITAMIN B12: Vitamin B-12: 625 pg/mL (ref 180–914)

## 2024-08-09 LAB — FOLATE: Folate: 3.4 ng/mL — ABNORMAL LOW (ref >5.9)

## 2024-08-09 LAB — FERRITIN: Ferritin: 23 ng/mL (ref 11–307)

## 2024-08-09 NOTE — Progress Notes (Signed)
 [facility]  [address] [place],  [place]  [address] [phone]  Clinic Day:  08/09/2024  Referring physician: Benjamine Lauraine DASEN, NP  ASSESSMENT & PLAN:   Assessment & Plan: Iron  deficiency anemia following bariatric surgery Iron  deficiency anemia felt to be due to decreased absorption after gastric bypass surgery.  Her hemoglobin remains normal.  She does not have evidence of recurrent iron  deficiency.  She knows to continue iron  supplement daily.  I will plan to see her back in 6 months with a CBC, iron  panel and ferritin.  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 Her folate is low again today. I will have her resume folic acid  1000 mcg daily.  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  She continues copper  supplement daily.    The patient understands the plans discussed today and is in agreement with them.  She knows to contact our office if she develops concerns prior to her next appointment.   I provided 15 minutes of face-to-face time during this encounter and > 50% was spent counseling as documented under my assessment and plan.    Andrez DELENA Foy, PA-C  [facility] [employer] - A DEPT OF [facility]. [facility] [address] [place] [address] Dept: [phone] Dept Fax: [phone]   Orders Placed This Encounter  Procedures   Copper , serum    Standing Status:   Future    Number of Occurrences:   1    Expected Date:   08/09/2024    Expiration Date:   08/09/2025   Folate    Standing Status:   Future    Number of Occurrences:   1    Expected Date:   08/09/2024    Expiration Date:   11/07/2024   Vitamin B12    Standing Status:   Future    Number of Occurrences:   1    Expected Date:   08/09/2024    Expiration Date:   11/07/2024      CHIEF COMPLAINT:  CC: Iron  deficiency after gastric  bypass  Current Treatment: Oral vitamin supplements  HISTORY OF PRESENT ILLNESS:  Amanda [name] is a 33 year old with iron  deficiency anemia due to decreased absorption after gastric bypass who we began seeing in September 2024.  She was also found to have folate and copper  deficiency.  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  She had been taking a bariatric multivitamin.  She was treated with IV iron  in the form Venofer  in September and again in November 2024 with a good response.  She was also found to have mild folate and copper  deficiency, so was placed on folic acid  and copper .  She saw Dr. Larene and had EGD and colonoscopy in December 2024 which did not reveal any source of bleeding or other abnormality.  She continues a bariatric multivitamin, as well as iron , folic acid  and copper  supplements***   INTERVAL HISTORY:  Lilit is here today for repeat clinical assessment.  She denies progressive fatigue concerning for recurrent anemia.  She states her menses are regular and she does not have heavy bleeding.  She denies fevers or chills. She denies pain. Her appetite is good. Her weight has decreased 6 pounds over last 6 months.  She continues oral vitamin supplements. She has some constipation which she manages with fiber supplements daily and laxatives as needed.  REVIEW OF SYSTEMS:   Review of Systems  Constitutional:  Negative for appetite change, chills, fatigue, fever and unexpected weight change.  HENT:   Negative for lump/mass, mouth sores and sore throat.   Respiratory:  Negative for cough and shortness of breath.   Cardiovascular:  Negative for chest pain and leg swelling.  Gastrointestinal:  Positive for constipation. Negative for abdominal pain, diarrhea, nausea and vomiting.  Endocrine: Negative for hot flashes.  Genitourinary:  Negative for difficulty urinating, dysuria, frequency and hematuria.   Musculoskeletal:  Negative for arthralgias,  back pain and myalgias.  Skin:  Negative for rash.  Neurological:  Negative for dizziness and headaches.  Hematological:  Negative for adenopathy. Does not bruise/bleed easily.  Psychiatric/Behavioral:  Negative for depression and sleep disturbance. The patient is not nervous/anxious.      VITALS:   Blood pressure 112/74, pulse 73, temperature 98 F (36.7 C), temperature source Oral, resp. rate 14, height 5' 3 (1.6 m), weight 146 lb 8 oz (66.5 kg), last menstrual period 07/27/2024, SpO2 100%.  Wt Readings from Last 3 Encounters:  08/09/24 146 lb 8 oz (66.5 kg)  01/30/24 152 lb 9.6 oz (69.2 kg)  10/20/23 140 lb (63.5 kg)    Body mass index is 25.95 kg/m.  Performance status (ECOG): 0 - Asymptomatic    PHYSICAL EXAM:   Physical Exam Vitals and nursing note reviewed.  Constitutional:      General: She is not in acute distress.    Appearance: Normal appearance.  HENT:     Head: Normocephalic and atraumatic.     Mouth/Throat:     Mouth: Mucous membranes are moist.     Pharynx: Oropharynx is clear. No oropharyngeal exudate or posterior oropharyngeal erythema.  Eyes:     General: No scleral icterus.    Extraocular Movements: Extraocular movements intact.     Conjunctiva/sclera: Conjunctivae normal.     Pupils: Pupils are equal, round, and reactive to light.  Cardiovascular:     Rate and Rhythm: Normal rate and regular rhythm.     Heart sounds: Normal heart sounds. No murmur heard.    No friction rub. No gallop.  Pulmonary:     Effort: Pulmonary effort is normal.     Breath sounds: Normal breath sounds. No wheezing, rhonchi or rales.  Abdominal:     General: There is no distension.     Palpations: Abdomen is soft. There is no hepatomegaly, splenomegaly or mass.     Tenderness: There is no abdominal tenderness.  Musculoskeletal:        General: Normal range of motion.     Cervical back: Normal range of motion and neck supple. No tenderness.     Right lower leg: No edema.      Left lower leg: No edema.  Lymphadenopathy:     Cervical: No cervical adenopathy.     Upper Body:     Right upper body: No supraclavicular or axillary adenopathy.     Left upper body: No supraclavicular or axillary adenopathy.     Lower Body: No right inguinal adenopathy. No left inguinal adenopathy.  Skin:    General: Skin is warm and dry.     Coloration: Skin is not jaundiced.     Findings: No rash.  Neurological:     Mental Status: She is alert and oriented to person, place, and time.     Cranial Nerves: No cranial nerve deficit.  Psychiatric:        Mood and Affect: Mood  normal.        Behavior: Behavior normal.        Thought Content: Thought content normal.     LABS:      Latest Ref Rng & Units 08/09/2024    8:37 AM 01/30/2024    8:30 AM 09/27/2023   10:22 AM  CBC  WBC 4.0 - 10.5 K/uL 4.3  4.6  4.0   Hemoglobin 12.0 - 15.0 g/dL 86.1  85.3  85.4   Hematocrit 36.0 - 46.0 % 42.0  45.1  43.0   Platelets 150 - 400 K/uL 233  256  252       Latest Ref Rng & Units 03/25/2016   12:21 PM  CMP  Glucose 65 - 99 mg/dL 898   BUN 6 - 20 mg/dL <5   Creatinine 9.55 - 1.00 mg/dL 9.56   Sodium 864 - 854 mmol/L 135   Potassium 3.5 - 5.1 mmol/L 3.8   Chloride 101 - 111 mmol/L 107   CO2 22 - 32 mmol/L 21   Calcium  8.9 - 10.3 mg/dL 9.2   Total Protein 6.5 - 8.1 g/dL 6.6   Total Bilirubin 0.3 - 1.2 mg/dL 0.3   Alkaline Phos 38 - 126 U/L 57   AST 15 - 41 U/L 15   ALT 14 - 54 U/L 13     Lab Results  Component Value Date   TIBC 343 08/09/2024   TIBC 386 01/30/2024   FERRITIN 23 08/09/2024   FERRITIN 16 01/30/2024   FERRITIN 56 09/27/2023   IRONPCTSAT 31 08/09/2024   IRONPCTSAT 22 01/30/2024     STUDIES:   No results found.    HISTORY:   Past Medical History:  Diagnosis Date   Celiac disease    Polycystic ovarian syndrome     Past Surgical History:  Procedure Laterality Date   APPENDECTOMY     CESAREAN SECTION N/A 04/15/2016   Procedure: CESAREAN SECTION;   Surgeon: Norleen LULLA Server, MD;  Location: [facility] BIRTHING SUITES;  Service: Obstetrics;  Laterality: N/A;   foot surgery      GASTRIC BYPASS     TONSILLECTOMY      History reviewed. No pertinent family history.  Social History:  reports that she has never smoked. She has never used smokeless tobacco. She reports that she does not drink alcohol and does not use drugs.The patient is alone today.  Allergies:  Allergies  Allergen Reactions   Nsaids Other (See Comments)    S/P gastric bypass   Gluten Meal Diarrhea and Other (See Comments)    Celiac disease    Current Medications: Current Outpatient Medications  Medication Sig Dispense Refill   buPROPion (WELLBUTRIN XL) 150 MG 24 hr tablet Take 150 mg by mouth every evening.     buPROPion (WELLBUTRIN XL) 300 MG 24 hr tablet Take 300 mg by mouth every morning.     calcium  citrate (CALCITRATE - DOSED IN MG ELEMENTAL CALCIUM ) 950 (200 Ca) MG tablet Take 200 mg of elemental calcium  by mouth daily.     Copper  Gluconate (COPPER  CAPS PO) Take 1 tablet by mouth daily.     estradiol  (ESTRACE ) 0.01 % CREA vaginal cream Place 1 Applicatorful vaginally.     FERROUS FUMARATE PO Take 1 each by mouth daily. 45 mg iron      folic acid  (FOLVITE ) 1 MG tablet Take 1 tablet (1 mg total) by mouth daily.     lisdexamfetamine (VYVANSE) 30 MG capsule Take 30 mg by mouth every morning.  metFORMIN (GLUCOPHAGE-XR) 500 MG 24 hr tablet Take 500 mg by mouth every evening. Two tabs every evening     Multiple Vitamin (MULTIVITAMIN) tablet Take 1 tablet by mouth daily. Bariatric MVI     progesterone (PROMETRIUM) 100 MG capsule Take 100 mg by mouth at bedtime.     testosterone cypionate (DEPOTESTOSTERONE CYPIONATE) 200 MG/ML injection Inject into the skin.     tirzepatide (MOUNJARO) 5 MG/0.5ML Pen Inject 5 mg into the skin once a week.     No current facility-administered medications for this visit.

## 2024-08-09 NOTE — Telephone Encounter (Signed)
 Patient has been scheduled for follow-up visit per 08/08/24 LOS.  Pt given an appt calendar with date and time.

## 2024-08-09 NOTE — Assessment & Plan Note (Addendum)
 Iron  deficiency anemia felt to be due to decreased absorption after gastric bypass surgery.  Her hemoglobin remains normal.  She does not have evidence of recurrent iron  deficiency.  She knows to continue iron  supplement daily.  I will plan to see her back in 6 months with a CBC, iron  panel and ferritin.

## 2024-08-09 NOTE — Assessment & Plan Note (Signed)
 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  She continues copper  supplement daily.

## 2024-08-09 NOTE — Assessment & Plan Note (Addendum)
 Folate deficiency felt to be due to decreased absorption after bariatric surgery. Her folate is low again today. I will have her resume folic acid  1000 mcg daily.

## 2024-08-10 LAB — COPPER, SERUM: Copper: 73 ug/dL — ABNORMAL LOW (ref 80–158)

## 2024-08-14 ENCOUNTER — Telehealth: Payer: Self-pay

## 2024-08-14 NOTE — Telephone Encounter (Signed)
 Patient stated that she was till taking her copper  and folic acid , she will double the amount she is taking. She has been advised her other labs are normal.

## 2024-08-14 NOTE — Telephone Encounter (Signed)
 [name], Amanda [name], CMA Please let her know her copper  and folic acid  are low. Is she taking supplements of both? If not, resume, if so double them. Thanks

## 2024-08-14 NOTE — Telephone Encounter (Signed)
 Pt states she is calling to check on her labs. Someone had called her yesterday.

## 2024-09-30 ENCOUNTER — Ambulatory Visit (HOSPITAL_BASED_OUTPATIENT_CLINIC_OR_DEPARTMENT_OTHER)

## 2025-01-28 ENCOUNTER — Inpatient Hospital Stay

## 2025-01-28 ENCOUNTER — Inpatient Hospital Stay: Admitting: Hematology and Oncology

## 2025-02-04 ENCOUNTER — Ambulatory Visit: Admitting: "Endocrinology
